# Patient Record
Sex: Male | Born: 1960 | Race: White | Hispanic: No | Marital: Single | Smoking: Current every day smoker
Health system: Southern US, Community
[De-identification: ages and names within clinical notes are randomized; demographics above are authoritative.]

## PROBLEM LIST (undated history)

## (undated) DIAGNOSIS — L409 Psoriasis, unspecified: Secondary | ICD-10-CM

## (undated) DIAGNOSIS — F1010 Alcohol abuse, uncomplicated: Secondary | ICD-10-CM

## (undated) DIAGNOSIS — B1920 Unspecified viral hepatitis C without hepatic coma: Secondary | ICD-10-CM

## (undated) DIAGNOSIS — R569 Unspecified convulsions: Secondary | ICD-10-CM

## (undated) DIAGNOSIS — J449 Chronic obstructive pulmonary disease, unspecified: Secondary | ICD-10-CM

## (undated) DIAGNOSIS — F419 Anxiety disorder, unspecified: Secondary | ICD-10-CM

## (undated) DIAGNOSIS — B1910 Unspecified viral hepatitis B without hepatic coma: Secondary | ICD-10-CM

## (undated) HISTORY — DX: Alcohol abuse, uncomplicated: F10.10

## (undated) HISTORY — DX: Unspecified viral hepatitis B without hepatic coma: B19.10

## (undated) HISTORY — DX: Anxiety disorder, unspecified: F41.9

## (undated) HISTORY — DX: Unspecified viral hepatitis C without hepatic coma: B19.20

---

## 2008-01-08 ENCOUNTER — Emergency Department: Payer: Self-pay | Admitting: Emergency Medicine

## 2008-09-08 HISTORY — PX: SKIN GRAFT: SHX250

## 2015-05-18 DIAGNOSIS — S62392A Other fracture of third metacarpal bone, right hand, initial encounter for closed fracture: Secondary | ICD-10-CM | POA: Insufficient documentation

## 2015-05-18 DIAGNOSIS — S62390A Other fracture of second metacarpal bone, right hand, initial encounter for closed fracture: Secondary | ICD-10-CM | POA: Insufficient documentation

## 2015-05-18 DIAGNOSIS — Y92410 Unspecified street and highway as the place of occurrence of the external cause: Secondary | ICD-10-CM | POA: Insufficient documentation

## 2015-05-18 DIAGNOSIS — Y998 Other external cause status: Secondary | ICD-10-CM | POA: Insufficient documentation

## 2015-05-18 DIAGNOSIS — S62394A Other fracture of fourth metacarpal bone, right hand, initial encounter for closed fracture: Secondary | ICD-10-CM | POA: Insufficient documentation

## 2015-05-18 DIAGNOSIS — Y9389 Activity, other specified: Secondary | ICD-10-CM | POA: Insufficient documentation

## 2015-05-18 DIAGNOSIS — Z720 Tobacco use: Secondary | ICD-10-CM | POA: Insufficient documentation

## 2015-05-19 ENCOUNTER — Encounter: Payer: Self-pay | Admitting: *Deleted

## 2015-05-19 ENCOUNTER — Emergency Department
Admission: EM | Admit: 2015-05-19 | Discharge: 2015-05-19 | Disposition: A | Discharge status: Home / Self Care | Payer: Self-pay | Attending: Emergency Medicine | Admitting: Emergency Medicine

## 2015-05-19 ENCOUNTER — Emergency Department: Payer: Self-pay

## 2015-05-19 DIAGNOSIS — S6291XA Unspecified fracture of right wrist and hand, initial encounter for closed fracture: Secondary | ICD-10-CM

## 2015-05-19 MED ORDER — OXYCODONE-ACETAMINOPHEN 5-325 MG PO TABS: 1.0000 | ORAL_TABLET | Freq: Four times a day (QID) | ORAL | Status: DC | PRN

## 2015-05-19 MED ORDER — OXYCODONE-ACETAMINOPHEN 5-325 MG PO TABS
1.0000 | ORAL_TABLET | Freq: Once | ORAL | Status: AC
  Administered 2015-05-19: 1 via ORAL
  Filled 2015-05-19: qty 1
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
## 2015-05-19 NOTE — Discharge Instructions (Signed)
You have nondisplaced fractures of the second, third, fourth, and likely fifth metacarpal (hand bone). Keep the splint on. Use the sling and keep your hand elevated when possible. Use Percocet if needed for pain control. Follow-up with orthopedics for reexamination and ongoing care early this coming week. Return to the emergency department if you have uncontrolled pain, if he loses sensation in your fingers or have difficulty with movement, or if you have other urgent concerns.  Hand Fracture, Metacarpals Fractures of metacarpals are breaks in the bones of the hand. They extend from the knuckles to the wrist. These bones can undergo many types of fractures. There are different ways of treating these fractures, all of which may be correct. TREATMENT  Hand fractures can be treated with:   Non-reduction - The fracture is casted without changing the positions of the fracture (bone pieces) involved. This fracture is usually left in a cast for 4 to 6 weeks or as your caregiver thinks necessary.  Closed reduction - The bones are moved back into position without surgery and then casted.  ORIF (open reduction and internal fixation) - The fracture site is opened and the bone pieces are fixed into place with some type of hardware, such as screws, etc. They are then casted. Your caregiver will discuss the type of fracture you have and the treatment that should be best for that problem. If surgery is chosen, let your caregivers know about the following.  LET YOUR CAREGIVERS KNOW ABOUT:  Allergies.  Medications you are taking, including herbs, eye drops, over the counter medications, and creams.  Use of steroids (by mouth or creams).  Previous problems with anesthetics or novocaine.  Possibility of pregnancy.  History of blood clots (thrombophlebitis).  History of bleeding or blood problems.  Previous surgeries.  Other health problems. AFTER THE PROCEDURE After surgery, you will be taken to the  recovery area where a nurse will watch and check your progress. Once you are awake, stable, and taking fluids well, barring other problems, you'll be allowed to go home. Once home, an ice pack applied to your operative site may help with pain and keep the swelling down. HOME CARE INSTRUCTIONS   Follow your caregiver's instructions as to activities, exercises, physical therapy, and driving a car.  Daily exercise is helpful for keeping range of motion and strength. Exercise as instructed.  To lessen swelling, keep the injured hand elevated above the level of your heart as much as possible.  Apply ice to the injury for 15-20 minutes each hour while awake for the first 2 days. Put the ice in a plastic bag and place a thin towel between the bag of ice and your cast.  Move the fingers of your casted hand several times a day.  If a plaster or fiberglass cast was applied:  Do not try to scratch the skin under the cast using a sharp or pointed object.  Check the skin around the cast every day. You may put lotion on red or sore areas.  Keep your cast dry. Your cast can be protected during bathing with a plastic bag. Do not put your cast into the water.  If a plaster splint was applied:  Wear your splint for as long as directed by your caregiver or until seen again.  Do not get your splint wet. Protect it during bathing with a plastic bag.  You may loosen the elastic bandage around the splint if your fingers start to get numb, tingle, get cold or  turn blue.  Do not put pressure on your cast or splint; this may cause it to break. Especially, do not lean plaster casts on hard surfaces for 24 hours after application.  Take medications as directed by your caregiver.  Only take over-the-counter or prescription medicines for pain, discomfort, or fever as directed by your caregiver.  Follow-up as provided by your caregiver. This is very important in order to avoid permanent injury or disability and  chronic pain. SEEK MEDICAL CARE IF:   Increased bleeding (more than a small spot) from beneath your cast or splint if there is beneath the cast as with an open reduction.  Redness, swelling, or increasing pain in the wound or from beneath your cast or splint.  Pus coming from wound or from beneath your cast or splint.  An unexplained oral temperature above 102 F (38.9 C) develops, or as your caregiver suggests.  A foul smell coming from the wound or dressing or from beneath your cast or splint.  You have a problem moving any of your fingers. SEEK IMMEDIATE MEDICAL CARE IF:   You develop a rash  You have difficulty breathing  You have any allergy problems If you do not have a window in your cast for observing the wound, a discharge or minor bleeding may show up as a stain on the outside of your cast. Report these findings to your caregiver. MAKE SURE YOU:   Understand these instructions.  Will watch your condition.  Will get help right away if you are not doing well or get worse. Document Released: 08/25/2005 Document Revised: 11/17/2011 Document Reviewed: 04/13/2008 [facility] Patient Information 2015 [place], [place]. This information is not intended to replace advice given to you by your health care provider. Make sure you discuss any questions you have with your health care provider.

## 2015-11-08 ENCOUNTER — Encounter: Payer: Self-pay | Admitting: Family Medicine

## 2015-11-08 ENCOUNTER — Ambulatory Visit (INDEPENDENT_AMBULATORY_CARE_PROVIDER_SITE_OTHER): Payer: Medicaid Other | Admitting: Family Medicine

## 2015-11-08 VITALS — BP 133/79 | HR 69 | Temp 98.5°F | Ht 67.2 in | Wt 227.0 lb

## 2015-11-08 DIAGNOSIS — Z1211 Encounter for screening for malignant neoplasm of colon: Secondary | ICD-10-CM

## 2015-11-08 DIAGNOSIS — F32A Depression, unspecified: Secondary | ICD-10-CM | POA: Insufficient documentation

## 2015-11-08 DIAGNOSIS — Z125 Encounter for screening for malignant neoplasm of prostate: Secondary | ICD-10-CM | POA: Diagnosis not present

## 2015-11-08 DIAGNOSIS — F1110 Opioid abuse, uncomplicated: Secondary | ICD-10-CM | POA: Diagnosis not present

## 2015-11-08 DIAGNOSIS — Z113 Encounter for screening for infections with a predominantly sexual mode of transmission: Secondary | ICD-10-CM | POA: Diagnosis not present

## 2015-11-08 DIAGNOSIS — B181 Chronic viral hepatitis B without delta-agent: Secondary | ICD-10-CM

## 2015-11-08 DIAGNOSIS — B182 Chronic viral hepatitis C: Secondary | ICD-10-CM | POA: Diagnosis not present

## 2015-11-08 DIAGNOSIS — F329 Major depressive disorder, single episode, unspecified: Secondary | ICD-10-CM | POA: Diagnosis not present

## 2015-11-08 DIAGNOSIS — Z23 Encounter for immunization: Secondary | ICD-10-CM | POA: Diagnosis not present

## 2015-11-08 DIAGNOSIS — B1910 Unspecified viral hepatitis B without hepatic coma: Secondary | ICD-10-CM | POA: Insufficient documentation

## 2015-11-08 DIAGNOSIS — F1010 Alcohol abuse, uncomplicated: Secondary | ICD-10-CM | POA: Insufficient documentation

## 2015-11-08 DIAGNOSIS — F419 Anxiety disorder, unspecified: Secondary | ICD-10-CM

## 2015-11-08 DIAGNOSIS — B1920 Unspecified viral hepatitis C without hepatic coma: Secondary | ICD-10-CM | POA: Insufficient documentation

## 2015-11-08 NOTE — Assessment & Plan Note (Signed)
Checking on labs, [place] with elastography ordered today. Referral to GI made today. Await results.

## 2015-11-08 NOTE — Assessment & Plan Note (Signed)
Does not want to take SSRI- referral to psychiatry made today. Continue to monitor.

## 2015-11-08 NOTE — Assessment & Plan Note (Signed)
In remission at this time. Last used 4 years ago. Continue to monitor.

## 2015-11-08 NOTE — Progress Notes (Signed)
BP 133/79 mmHg  Pulse 69  Temp(Src) 98.5 F (36.9 C)  Ht 5' 7.2" (1.707 m)  Wt 227 lb (102.967 kg)  BMI 35.34 kg/m2  SpO2 98%   Subjective:    Patient ID: Jimmy Simmons, male    DOB: [birth year], 55 y.o.   MRN: [id number]  HPI: Jimmy Simmons is a 55 y.o. male who presents today to establish care. He hasn't seen a doctor regularly in about 16 years  Chief Complaint  Patient presents with  . Hepatitis C  . Anxiety   He had a problem with oxycodone in the past, has not taken them in about 4 years. Has not had any in that time. Has not had any cravings at this time. He was on other heavier drugs, IV drugs, cocaine. Does not work with NA. Did it on his own. Feeling good now. No one around using at this time. Has been drinking large amounts of ETOH for about 12 years. Was drinking hard liquor about 1/2 to a pint a day up to this week and then started going down to about a couple of beers a day. Functioning alcoholic. Has never had DTs. Never had seizures coming off ETOH. Has never come off ETOH in a long time.   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 Hep C transmission: IV drugs as a teenager Genotype: to be drawn today Viral load:  To be drawn today Hepatology evaluation:no Liver biopsy:no  Cirrhosis: unknown Antiviral therapy:no Hepatocellular carcinoma screening: no Esophageal varices screening/EGD: no Hepatitis A Vaccine: unknown Hepatitis B Vaccine: unknown Pneumovax Vaccine: given today  ANXIETY/STRESS Duration: years and years Anxious mood: yes  Excessive worrying: yes Irritability: yes  Sweating: no Nausea: no Palpitations:yes Hyperventilation: yes Panic attacks: yes Agoraphobia: no  Obscessions/compulsions: no Depressed mood: yes Depression screen PHQ 2/9 11/08/2015  Decreased Interest 2  Down, Depressed, Hopeless 2  PHQ - 2 Score 4  Altered sleeping 2  Tired, decreased energy 2  Change in appetite 2  Feeling bad or failure  about yourself  2  Trouble concentrating 2  Moving slowly or fidgety/restless 1  Suicidal thoughts 0  PHQ-9 Score 15  Difficult doing work/chores Very difficult   GAD 7 : Generalized Anxiety Score 11/08/2015  Nervous, Anxious, on Edge 2  Control/stop worrying 2  Worry too much - different things 2  Trouble relaxing 2  Restless 1  Easily annoyed or irritable 2  Afraid - awful might happen 0  Total GAD 7 Score 11  Anxiety Difficulty Somewhat difficult  Anhedonia: no Weight changes: no Insomnia: yes hard to fall asleep  Hypersomnia: no Fatigue/loss of energy: yes Feelings of worthlessness: yes Feelings of guilt: yes Impaired concentration/indecisiveness: yes Suicidal ideations: no  Crying spells: yes Recent Stressors/Life Changes: yes   Relationship problems: no   Family stress: yes     Financial stress: yes    Job stress: yes    Recent death/loss: no  Relevant past medical, surgical, family and social history reviewed and updated as indicated. Interim medical history since our last visit reviewed. Allergies and medications reviewed and updated.  Review of Systems  Constitutional: Negative.   Respiratory: Negative.   Cardiovascular: Negative.   Gastrointestinal: Negative.   Psychiatric/Behavioral: Negative.     Per HPI unless specifically indicated above     Objective:    BP 133/79 mmHg  Pulse 69  Temp(Src) 98.5 F (36.9 C)  Ht 5' 7.2" (1.707 m)  Wt 227 lb (  102.967 kg)  BMI 35.34 kg/m2  SpO2 98%  Wt Readings from Last 3 Encounters:  11/08/15 227 lb (102.967 kg)  05/19/15 220 lb (99.791 kg)    Physical Exam  Constitutional: He is oriented to person, place, and time. He appears well-developed and well-nourished. No distress.  HENT:  Head: Normocephalic and atraumatic.  Right Ear: Hearing and external ear normal.  Left Ear: Hearing and external ear normal.  Nose: Nose normal.  Mouth/Throat: Oropharynx is clear and moist. No oropharyngeal exudate.  Eyes:  Conjunctivae and lids are normal. Pupils are equal, round, and reactive to light. Right eye exhibits no discharge. Left eye exhibits no discharge. No scleral icterus.  Neck: Normal range of motion. Neck supple. No JVD present. No tracheal deviation present. No thyromegaly present.  Cardiovascular: Normal rate, regular rhythm, normal heart sounds and intact distal pulses.  Exam reveals no gallop and no friction rub.   No murmur heard. Pulmonary/Chest: Effort normal and breath sounds normal. No stridor. No respiratory distress. He has no wheezes. He has no rales. He exhibits no tenderness.  Abdominal: Soft. Bowel sounds are normal. He exhibits no distension and no mass. There is no tenderness. There is no rebound and no guarding.  Musculoskeletal: Normal range of motion.  Lymphadenopathy:    He has no cervical adenopathy.  Neurological: He is alert and oriented to person, place, and time.  Skin: Skin is warm, dry and intact. No rash noted. No erythema. No pallor.  Psychiatric: He has a normal mood and affect. His speech is normal and behavior is normal. Judgment and thought content normal. Cognition and memory are normal.  Nursing note and vitals reviewed.   No results found for this or any previous visit.    Assessment & Plan:   Problem List Items Addressed This Visit      Digestive   Hepatitis C    Checking on labs, [place] with elastography ordered today. Referral to GI made today. Await results.       Relevant Orders   CBC with Differential/Platelet   Comprehensive metabolic panel   Hepatitis c vrs RNA detect by PCR-qual   Hepatitis B surface antibody   Hepatitis, Acute   Hepatitis C Genotype   [place] ARFI ELASTOGRAPHY ADD ON   US Abdomen Limited RUQ   Ambulatory referral to Gastroenterology   Hepatitis B    Checking on labs, [place] with elastography ordered today. Referral to GI made today. Await results.       Relevant Orders   CBC with Differential/Platelet   Comprehensive metabolic  panel   Hepatitis, Acute   [place] ARFI ELASTOGRAPHY ADD ON   US Abdomen Limited RUQ   Ambulatory referral to Gastroenterology     Other   Anxiety    Does not want to take SSRI- referral to psychiatry made today. Continue to monitor.       Relevant Orders   CBC with Differential/Platelet   Comprehensive metabolic panel   TSH   Ambulatory referral to Psychiatry   Alcohol abuse    Advised patient that it would be dangerous for him to try to come off entirely at home. Referral to psychiatry made today. Advised him to go to the hospital for detox when he is ready.      Relevant Orders   CBC with Differential/Platelet   Comprehensive metabolic panel   Ambulatory referral to Psychiatry   Opiate abuse, episodic    In remission at this time. Last used 4 years ago. Continue  to monitor.       Relevant Orders   Ambulatory referral to Psychiatry   Depression    Does not want to take SSRI- referral to psychiatry made today. Continue to monitor.       Relevant Orders   Ambulatory referral to Psychiatry    Other Visit Diagnoses    Screening for STD (sexually transmitted disease)    -  Primary    Labs drawn today.    Relevant Orders    HIV antibody    Screening for colon cancer        Referral generated today    Relevant Orders    Ambulatory referral to General Surgery    Ambulatory referral to Gastroenterology    Immunization due        Pneumovax, tdap and flu shot given today.    Screening for prostate cancer        Labs drawn today    Relevant Orders    PSA        Follow up plan: Return in about 4 weeks (around 12/06/2015) for follow up.

## 2015-11-08 NOTE — Assessment & Plan Note (Signed)
Advised patient that it would be dangerous for him to try to come off entirely at home. Referral to psychiatry made today. Advised him to go to the hospital for detox when he is ready.

## 2015-11-09 ENCOUNTER — Other Ambulatory Visit: Payer: Self-pay

## 2015-11-09 ENCOUNTER — Telehealth: Payer: Self-pay

## 2015-11-09 NOTE — Telephone Encounter (Signed)
Gastroenterology Pre-Procedure Review  Request Date: 11/15/15 Requesting Physician: Dr. Park Liter  PATIENT REVIEW QUESTIONS: The patient responded to the following health history questions as indicated:    1. Are you having any GI issues? no 2. Do you have a personal history of Polyps? no 3. Do you have a family history of Colon Cancer or Polyps? no 4. Diabetes Mellitus? no 5. Joint replacements in the past 12 months?no 6. Major health problems in the past 3 months?no 7. Any artificial heart valves, MVP, or defibrillator?no    MEDICATIONS & ALLERGIES:    Patient reports the following regarding taking any anticoagulation/antiplatelet therapy:   Plavix, Coumadin, Eliquis, Xarelto, Lovenox, Pradaxa, Brilinta, or Effient? no Aspirin? yes (ASA 81mg )  Patient confirms/reports the following medications:  No current outpatient prescriptions on file.   No current facility-administered medications for this visit.    Patient confirms/reports the following allergies:  No Known Allergies  No orders of the defined types were placed in this encounter.    AUTHORIZATION INFORMATION Primary Insurance: 1D#: Group #:  Secondary Insurance: 1D#: Group #:  SCHEDULE INFORMATION: Date: 11/15/15 Time: Location: [place]

## 2015-11-12 ENCOUNTER — Encounter: Payer: Self-pay | Admitting: *Deleted

## 2015-11-12 ENCOUNTER — Other Ambulatory Visit: Payer: Self-pay | Admitting: Family Medicine

## 2015-11-13 LAB — COMPREHENSIVE METABOLIC PANEL
ALT: 137 IU/L — AB (ref 0–44)
AST: 117 IU/L — AB (ref 0–40)
Albumin/Globulin Ratio: 1.2 (ref 1.1–2.5)
Albumin: 3.6 g/dL (ref 3.5–5.5)
Alkaline Phosphatase: 114 IU/L (ref 39–117)
BUN/Creatinine Ratio: 15 (ref 9–20)
BUN: 11 mg/dL (ref 6–24)
Bilirubin Total: 0.6 mg/dL (ref 0.0–1.2)
CALCIUM: 9 mg/dL (ref 8.7–10.2)
CO2: 23 mmol/L (ref 18–29)
Chloride: 103 mmol/L (ref 96–106)
Creatinine, Ser: 0.74 mg/dL — ABNORMAL LOW (ref 0.76–1.27)
GFR, EST AFRICAN AMERICAN: 122 mL/min/{1.73_m2} (ref >59)
GFR, EST NON AFRICAN AMERICAN: 105 mL/min/{1.73_m2} (ref >59)
GLUCOSE: 103 mg/dL — AB (ref 65–99)
Globulin, Total: 2.9 g/dL (ref 1.5–4.5)
POTASSIUM: 3.9 mmol/L (ref 3.5–5.2)
Sodium: 141 mmol/L (ref 134–144)
TOTAL PROTEIN: 6.5 g/dL (ref 6.0–8.5)

## 2015-11-13 LAB — HEPATITIS C VRS RNA DETECT BY PCR-QUAL: HCV RNA NAA Qualitative: POSITIVE — AB

## 2015-11-13 LAB — CBC WITH DIFFERENTIAL/PLATELET
BASOS ABS: 0 10*3/uL (ref 0.0–0.2)
Basos: 1 %
EOS (ABSOLUTE): 0.1 10*3/uL (ref 0.0–0.4)
Eos: 2 %
Hematocrit: 41.2 % (ref 37.5–51.0)
Hemoglobin: 13.9 g/dL (ref 12.6–17.7)
IMMATURE GRANS (ABS): 0 10*3/uL (ref 0.0–0.1)
IMMATURE GRANULOCYTES: 0 %
LYMPHS: 48 %
Lymphocytes Absolute: 2.9 10*3/uL (ref 0.7–3.1)
MCH: 34.8 pg — ABNORMAL HIGH (ref 26.6–33.0)
MCHC: 33.7 g/dL (ref 31.5–35.7)
MCV: 103 fL — ABNORMAL HIGH (ref 79–97)
MONOCYTES: 7 %
Monocytes Absolute: 0.4 10*3/uL (ref 0.1–0.9)
NEUTROS ABS: 2.5 10*3/uL (ref 1.4–7.0)
NEUTROS PCT: 42 %
PLATELETS: 73 10*3/uL — AB (ref 150–379)
RBC: 4 x10E6/uL — AB (ref 4.14–5.80)
RDW: 13.3 % (ref 12.3–15.4)
WBC: 6 10*3/uL (ref 3.4–10.8)

## 2015-11-13 LAB — TSH: TSH: 2.11 u[IU]/mL (ref 0.450–4.500)

## 2015-11-13 LAB — HEPATITIS C GENOTYPE

## 2015-11-13 LAB — HEPATITIS PANEL, ACUTE
HEP A IGM: NEGATIVE
HEP B S AG: NEGATIVE
Hep B C IgM: NEGATIVE

## 2015-11-13 LAB — HIV ANTIBODY (ROUTINE TESTING W REFLEX): HIV SCREEN 4TH GENERATION: NONREACTIVE

## 2015-11-13 LAB — PSA: Prostate Specific Ag, Serum: 0.7 ng/mL (ref 0.0–4.0)

## 2015-11-13 LAB — HEPATITIS B SURFACE ANTIBODY, QUANTITATIVE

## 2015-11-14 NOTE — Discharge Instructions (Signed)

## 2015-11-15 ENCOUNTER — Encounter
Admission: RE | Disposition: A | Discharge status: Home / Self Care | Payer: Self-pay | Source: Ambulatory Visit | Attending: Gastroenterology

## 2015-11-15 ENCOUNTER — Ambulatory Visit
Admission: RE | Admit: 2015-11-15 | Discharge: 2015-11-15 | Disposition: A | Discharge status: Home / Self Care | Payer: Medicaid Other | Source: Ambulatory Visit | Attending: Gastroenterology | Admitting: Gastroenterology

## 2015-11-15 ENCOUNTER — Ambulatory Visit: Payer: Medicaid Other | Admitting: Anesthesiology

## 2015-11-15 DIAGNOSIS — K621 Rectal polyp: Secondary | ICD-10-CM | POA: Diagnosis not present

## 2015-11-15 DIAGNOSIS — B1920 Unspecified viral hepatitis C without hepatic coma: Secondary | ICD-10-CM | POA: Diagnosis not present

## 2015-11-15 DIAGNOSIS — K641 Second degree hemorrhoids: Secondary | ICD-10-CM | POA: Diagnosis not present

## 2015-11-15 DIAGNOSIS — F17210 Nicotine dependence, cigarettes, uncomplicated: Secondary | ICD-10-CM | POA: Insufficient documentation

## 2015-11-15 DIAGNOSIS — Z1211 Encounter for screening for malignant neoplasm of colon: Secondary | ICD-10-CM | POA: Diagnosis not present

## 2015-11-15 DIAGNOSIS — D125 Benign neoplasm of sigmoid colon: Secondary | ICD-10-CM | POA: Diagnosis not present

## 2015-11-15 DIAGNOSIS — F419 Anxiety disorder, unspecified: Secondary | ICD-10-CM | POA: Insufficient documentation

## 2015-11-15 DIAGNOSIS — F1010 Alcohol abuse, uncomplicated: Secondary | ICD-10-CM | POA: Diagnosis not present

## 2015-11-15 HISTORY — PX: COLONOSCOPY WITH PROPOFOL: SHX5780

## 2015-11-15 HISTORY — PX: POLYPECTOMY: SHX5525

## 2015-11-15 SURGERY — COLONOSCOPY WITH PROPOFOL
Anesthesia: Monitor Anesthesia Care | Wound class: Contaminated

## 2015-11-15 MED ORDER — PROPOFOL 10 MG/ML IV BOLUS
INTRAVENOUS | Status: DC | PRN
  Administered 2015-11-15: 20 mg via INTRAVENOUS
  Administered 2015-11-15: 70 mg via INTRAVENOUS
  Administered 2015-11-15 (×8): 30 mg via INTRAVENOUS

## 2015-11-15 MED ORDER — LACTATED RINGERS IV SOLN
INTRAVENOUS | Status: DC
  Administered 2015-11-15: 11:00:00 via INTRAVENOUS

## 2015-11-15 MED ORDER — LIDOCAINE HCL (CARDIAC) 20 MG/ML IV SOLN
INTRAVENOUS | Status: DC | PRN
Start: 2015-11-15 — End: 2015-11-15
  Administered 2015-11-15: 50 mg via INTRAVENOUS

## 2015-11-15 MED ORDER — STERILE WATER FOR IRRIGATION IR SOLN
Status: DC | PRN
  Administered 2015-11-15: 12:00:00

## 2015-11-15 SURGICAL SUPPLY — 28 items
CANISTER SUCT 1200ML W/VALVE (MISCELLANEOUS) ×4 IMPLANT
FCP ESCP3.2XJMB 240X2.8X (MISCELLANEOUS)
FORCEPS BIOP RAD 4 LRG CAP 4 (CUTTING FORCEPS) IMPLANT
FORCEPS BIOP RJ4 240 W/NDL (MISCELLANEOUS)
FORCEPS ESCP3.2XJMB 240X2.8X (MISCELLANEOUS) IMPLANT
GOWN CVR UNV OPN BCK APRN NK (MISCELLANEOUS) ×4 IMPLANT
GOWN ISOL THUMB LOOP REG UNIV (MISCELLANEOUS) ×4
HEMOCLIP INSTINCT (CLIP) IMPLANT
INJECTOR VARIJECT VIN23 (MISCELLANEOUS) IMPLANT
KIT CO2 TUBING (TUBING) IMPLANT
KIT DEFENDO VALVE AND CONN (KITS) IMPLANT
KIT ENDO PROCEDURE OLY (KITS) ×4 IMPLANT
LIGATOR MULTIBAND 6SHOOTER MBL (MISCELLANEOUS) IMPLANT
MARKER SPOT ENDO TATTOO 5ML (MISCELLANEOUS) IMPLANT
PAD GROUND ADULT SPLIT (MISCELLANEOUS) IMPLANT
SNARE SHORT THROW 13M SML OVAL (MISCELLANEOUS) ×4 IMPLANT
SNARE SHORT THROW 30M LRG OVAL (MISCELLANEOUS) IMPLANT
SPOT EX ENDOSCOPIC TATTOO (MISCELLANEOUS)
SUCTION POLY TRAP 4CHAMBER (MISCELLANEOUS) IMPLANT
TRAP SUCTION POLY (MISCELLANEOUS) ×4 IMPLANT
TUBING CONN 6MMX3.1M (TUBING)
TUBING SUCTION CONN 0.25 STRL (TUBING) IMPLANT
UNDERPAD 30X60 958B10 (PK) (MISCELLANEOUS) IMPLANT
VALVE BIOPSY ENDO (VALVE) IMPLANT
VARIJECT INJECTOR VIN23 (MISCELLANEOUS)
WATER AUXILLARY (MISCELLANEOUS) IMPLANT
WATER STERILE IRR 250ML POUR (IV SOLUTION) ×4 IMPLANT
WATER STERILE IRR 500ML POUR (IV SOLUTION) IMPLANT

## 2015-11-15 NOTE — Anesthesia Preprocedure Evaluation (Signed)
Anesthesia Evaluation  Patient identified by MRN, date of birth, ID band Patient awake    Reviewed: Allergy & Precautions, H&P , NPO status   Airway Mallampati: II  TM Distance: >3 FB Neck ROM: full    Dental   Pulmonary Current Smoker,    Pulmonary exam normal        Cardiovascular Normal cardiovascular exam     Neuro/Psych PSYCHIATRIC DISORDERS    GI/Hepatic (+) Hepatitis -, B, C  Endo/Other    Renal/GU      Musculoskeletal   Abdominal   Peds  Hematology   Anesthesia Other Findings   Reproductive/Obstetrics                             Anesthesia Physical Anesthesia Plan  ASA: II  Anesthesia Plan: MAC   Post-op Pain Management:    Induction:   Airway Management Planned:   Additional Equipment:   Intra-op Plan:   Post-operative Plan:   Informed Consent: I have reviewed the patients History and Physical, chart, labs and discussed the procedure including the risks, benefits and alternatives for the proposed anesthesia with the patient or authorized representative who has indicated his/her understanding and acceptance.     Plan Discussed with: CRNA  Anesthesia Plan Comments:         Anesthesia Quick Evaluation

## 2015-11-15 NOTE — Anesthesia Postprocedure Evaluation (Signed)
Anesthesia Post Note  Patient: Jimmy Simmons  Procedure(s) Performed: Procedure(s) (LRB): COLONOSCOPY WITH PROPOFOL (N/A) POLYPECTOMY  Patient location during evaluation: PACU Anesthesia Type: MAC Level of consciousness: awake and alert Pain management: pain level controlled Vital Signs Assessment: post-procedure vital signs reviewed and stable Respiratory status: spontaneous breathing, nonlabored ventilation, respiratory function stable and patient connected to nasal cannula oxygen Cardiovascular status: stable and blood pressure returned to baseline Anesthetic complications: no    Amaryllis Dyke

## 2015-11-15 NOTE — Transfer of Care (Signed)
Immediate Anesthesia Transfer of Care Note  Patient: Jimmy Simmons  Procedure(s) Performed: Procedure(s): COLONOSCOPY WITH PROPOFOL (N/A) POLYPECTOMY  Patient Location: PACU  Anesthesia Type: MAC  Level of Consciousness: awake, alert  and patient cooperative  Airway and Oxygen Therapy: Patient Spontanous Breathing and Patient connected to supplemental oxygen  Post-op Assessment: Post-op Vital signs reviewed, Patient's Cardiovascular Status Stable, Respiratory Function Stable, Patent Airway and No signs of Nausea or vomiting  Post-op Vital Signs: Reviewed and stable  Complications: No apparent anesthesia complications

## 2015-11-15 NOTE — Anesthesia Procedure Notes (Signed)
Procedure Name: MAC Performed by: Mckensie Scotti Pre-anesthesia Checklist: Patient identified, Emergency Drugs available, Suction available, Timeout performed and Patient being monitored Patient Re-evaluated:Patient Re-evaluated prior to inductionOxygen Delivery Method: Nasal cannula Placement Confirmation: positive ETCO2       
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
## 2015-11-15 NOTE — Op Note (Signed)
[facility] Patient Name: Jimmy Simmons Procedure Date: 11/15/2015 11:26 AM MRN: [id number] Account #: [id number] Date of Birth: 11-01-1960 Admit Type: Outpatient Age: 55 Room: [facility] OR ROOM 01 Gender: Male Note Status: Finalized Procedure:            Colonoscopy Indications:          Screening for colorectal malignant neoplasm Providers:            Lucilla Lame, MD Referring MD:         Valerie Roys (Referring MD) Medicines:            Propofol per Anesthesia Complications:        No immediate complications. Procedure:            Pre-Anesthesia Assessment:                       - Prior to the procedure, a History and Physical was                        performed, and patient medications and allergies were                        reviewed. The patient's tolerance of previous                        anesthesia was also reviewed. The risks and benefits of                        the procedure and the sedation options and risks were                        discussed with the patient. All questions were                        answered, and informed consent was obtained. Prior                        Anticoagulants: The patient has taken no previous                        anticoagulant or antiplatelet agents. ASA Grade                        Assessment: II - A patient with mild systemic disease.                        After reviewing the risks and benefits, the patient was                        deemed in satisfactory condition to undergo the                        procedure.                       After obtaining informed consent, the colonoscope was                        passed under direct vision. Throughout the procedure,  the patient's blood pressure, pulse, and oxygen                        saturations were monitored continuously. The was                        introduced through the anus and advanced to the the                         cecum, identified by appendiceal orifice and ileocecal                        valve. The colonoscopy was performed without                        difficulty. The patient tolerated the procedure well.                        The quality of the bowel preparation was excellent. Findings:      The perianal and digital rectal examinations were normal.      A 5 mm polyp was found in the rectum. The polyp was sessile. The polyp       was removed with a cold snare. Resection and retrieval were complete.      A 5 mm polyp was found in the sigmoid colon. The polyp was sessile. The       polyp was removed with a cold snare. Resection and retrieval were       complete.      Non-bleeding internal hemorrhoids were found during retroflexion. The       hemorrhoids were Grade II (internal hemorrhoids that prolapse but reduce       spontaneously). Impression:           - One 5 mm polyp in the rectum, removed with a cold                        snare. Resected and retrieved.                       - One 5 mm polyp in the sigmoid colon, removed with a                        cold snare. Resected and retrieved.                       - Non-bleeding internal hemorrhoids. Recommendation:       - Repeat colonoscopy in 5 years if polyp adenoma and 10                        years if hyperplastic Procedure Code(s):    --- Professional ---                       [phone], Colonoscopy, flexible; with removal of tumor(s),                        polyp(s), or other lesion(s) by snare technique Diagnosis Code(s):    --- Professional ---                       Z12.11,  Encounter for screening for malignant neoplasm                        of colon                       K62.1, Rectal polyp                       D12.5, Benign neoplasm of sigmoid colon CPT copyright 2016 American Medical Association. All rights reserved. The codes documented in this report are preliminary and upon coder review may  be revised to meet current  compliance requirements. Lucilla Lame, MD 11/15/2015 11:50:05 AM This report has been signed electronically. Number of Addenda: 0 Note Initiated On: 11/15/2015 11:26 AM Scope Withdrawal Time: 0 hours 7 minutes 12 seconds  Total Procedure Duration: 0 hours 10 minutes 43 seconds       [facility]

## 2015-11-16 ENCOUNTER — Encounter: Payer: Self-pay | Admitting: Gastroenterology

## 2015-11-20 LAB — HCV RT-PCR, QUANT (NON-GRAPH)
HCV QUANTITATIVE BASELINE: 760000 IU/mL
HCV Quantitative Log: 6.279 Log10copy/mL
HCV Quantitative: 1900000 Copies/mL
IU log10: 5.881 Log10 IU/mL

## 2015-11-20 LAB — SPECIMEN STATUS REPORT

## 2015-11-23 ENCOUNTER — Ambulatory Visit
Admission: RE | Admit: 2015-11-23 | Discharge: 2015-11-23 | Disposition: A | Discharge status: Home / Self Care | Payer: Medicaid Other | Source: Ambulatory Visit | Attending: Family Medicine | Admitting: Family Medicine

## 2015-11-23 ENCOUNTER — Ambulatory Visit: Payer: No Typology Code available for payment source

## 2015-11-23 DIAGNOSIS — B181 Chronic viral hepatitis B without delta-agent: Secondary | ICD-10-CM

## 2015-11-23 DIAGNOSIS — B182 Chronic viral hepatitis C: Secondary | ICD-10-CM

## 2015-11-26 ENCOUNTER — Encounter: Payer: Self-pay | Admitting: Gastroenterology

## 2015-11-28 ENCOUNTER — Ambulatory Visit
Admission: RE | Admit: 2015-11-28 | Discharge: 2015-11-28 | Disposition: A | Discharge status: Home / Self Care | Payer: Medicaid Other | Source: Ambulatory Visit | Attending: Family Medicine | Admitting: Family Medicine

## 2015-11-28 DIAGNOSIS — K7689 Other specified diseases of liver: Secondary | ICD-10-CM | POA: Diagnosis not present

## 2015-11-28 DIAGNOSIS — B181 Chronic viral hepatitis B without delta-agent: Secondary | ICD-10-CM | POA: Insufficient documentation

## 2015-11-28 DIAGNOSIS — B182 Chronic viral hepatitis C: Secondary | ICD-10-CM | POA: Insufficient documentation

## 2015-12-06 ENCOUNTER — Ambulatory Visit (INDEPENDENT_AMBULATORY_CARE_PROVIDER_SITE_OTHER): Payer: Medicaid Other | Admitting: Family Medicine

## 2015-12-06 ENCOUNTER — Encounter: Payer: Self-pay | Admitting: Family Medicine

## 2015-12-06 VITALS — BP 139/87 | HR 75 | Temp 97.4°F | Ht 67.2 in | Wt 225.0 lb

## 2015-12-06 DIAGNOSIS — R252 Cramp and spasm: Secondary | ICD-10-CM | POA: Diagnosis not present

## 2015-12-06 DIAGNOSIS — Z720 Tobacco use: Secondary | ICD-10-CM

## 2015-12-06 DIAGNOSIS — D696 Thrombocytopenia, unspecified: Secondary | ICD-10-CM

## 2015-12-06 DIAGNOSIS — D229 Melanocytic nevi, unspecified: Secondary | ICD-10-CM | POA: Diagnosis not present

## 2015-12-06 DIAGNOSIS — Z23 Encounter for immunization: Secondary | ICD-10-CM

## 2015-12-06 DIAGNOSIS — F1010 Alcohol abuse, uncomplicated: Secondary | ICD-10-CM | POA: Diagnosis not present

## 2015-12-06 DIAGNOSIS — B182 Chronic viral hepatitis C: Secondary | ICD-10-CM | POA: Diagnosis not present

## 2015-12-06 NOTE — Assessment & Plan Note (Signed)
To see Dr. Allen Norris May 1st for eval of hep C and hep B. Hep A vaccine given today. Continue to monitor.

## 2015-12-06 NOTE — Assessment & Plan Note (Signed)
Encouraged patient to avoid ETOH, Call with any problems. Continue to monitor.

## 2015-12-06 NOTE — Progress Notes (Signed)
BP 139/87 mmHg  Pulse 75  Temp(Src) 97.4 F (36.3 C)  Ht 5' 7.2" (1.707 m)  Wt 225 lb (102.059 kg)  BMI 35.03 kg/m2  SpO2 98%   Subjective:    Patient ID: Jimmy Simmons, male    DOB: September 15, 1960, 55 y.o.   MRN: [id number]  HPI: Jimmy Simmons is a 55 y.o. male  Chief Complaint  Patient presents with  . Alcohol Problem  . Hepatitis C  . Rash   Hasn't drank since last weekend. Had a couple of beers last weekend, but has been feeling a little edgy. Didn't end up going to the hospital to get detoxed, but has been doing OK on his own.   HEPATITIS C Duration since diagnosis: chronic Hep C transmission:  Genotype: 1a Viral load:  1900000 Hepatology evaluation:no Liver biopsy:no  Cirrhosis: no Antiviral therapy:no Hepatocellular carcinoma screening: no Esophageal varices screening/EGD: no Hepatitis A Vaccine: Given today Hepatitis B Vaccine: Not up to Date Pneumovax Vaccine: Up to Date  LEG CRAMPS- Has been having some really bad cramps for a couple of years in his legs and into his arms.  Duration: past couple of years Pain: yes Severity: severe  Quality:  cramps Location: arms and legs  Bilateral:  yes, L>R Onset: gradual Frequency: intermittent worse with activity and being in the heat Time of  day:   at random Sudden unintentional leg jerking:   no Paresthesias:   no Decreased sensation:  no Weakness:   no Insomnia:   yes Fatigue:   yes Alleviating factors:  Aggravating factors: Status: fluctuating Treatments attempted: PRN flexeril  Relevant past medical, surgical, family and social history reviewed and updated as indicated. Interim medical history since our last visit reviewed. Allergies and medications reviewed and updated.  Review of Systems  Constitutional: Negative for fever, chills, diaphoresis, activity change, appetite change, fatigue and unexpected weight change.  Respiratory: Negative for apnea, cough, choking, chest tightness, shortness of breath,  wheezing and stridor.   Cardiovascular: Negative for chest pain, palpitations and leg swelling.  Musculoskeletal: Positive for myalgias. Negative for back pain, joint swelling, arthralgias, gait problem, neck pain and neck stiffness.  Skin: Negative for color change, pallor, rash and wound.  Psychiatric/Behavioral: Positive for sleep disturbance. Negative for suicidal ideas, hallucinations, behavioral problems, confusion, self-injury, dysphoric mood, decreased concentration and agitation. The patient is nervous/anxious. The patient is not hyperactive.     Per HPI unless specifically indicated above     Objective:    BP 139/87 mmHg  Pulse 75  Temp(Src) 97.4 F (36.3 C)  Ht 5' 7.2" (1.707 m)  Wt 225 lb (102.059 kg)  BMI 35.03 kg/m2  SpO2 98%  Wt Readings from Last 3 Encounters:  12/06/15 225 lb (102.059 kg)  11/15/15 219 lb (99.338 kg)  11/08/15 227 lb (102.967 kg)    Physical Exam  Constitutional: He is oriented to person, place, and time. He appears well-developed and well-nourished. No distress.  HENT:  Head: Normocephalic and atraumatic.  Right Ear: Hearing normal.  Left Ear: Hearing normal.  Nose: Nose normal.  Eyes: Conjunctivae and lids are normal. Right eye exhibits no discharge. Left eye exhibits no discharge. No scleral icterus.  Cardiovascular: Normal rate, regular rhythm, normal heart sounds and intact distal pulses.  Exam reveals no gallop and no friction rub.   No murmur heard. Pulmonary/Chest: Effort normal and breath sounds normal. No respiratory distress. He has no wheezes. He has no rales. He exhibits no tenderness.  Musculoskeletal: Normal range of motion.  Neurological: He is alert and oriented to person, place, and time.  Skin: Skin is warm, dry and intact. No rash noted. He is not diaphoretic. No erythema. No pallor.  >100 moles on arms and legs, extensive sun damage   Psychiatric: He has a normal mood and affect. His speech is normal and behavior is  normal. Judgment and thought content normal. Cognition and memory are normal.  Nursing note and vitals reviewed.   Results for orders placed or performed in visit on 11/08/15  HIV antibody  Result Value Ref Range   HIV Screen 4th Generation wRfx Non Reactive Non Reactive  CBC with Differential/Platelet  Result Value Ref Range   WBC 6.0 3.4 - 10.8 x10E3/uL   RBC 4.00 (L) 4.14 - 5.80 x10E6/uL   Hemoglobin 13.9 12.6 - 17.7 g/dL   Hematocrit 41.2 37.5 - 51.0 %   MCV 103 (H) 79 - 97 fL   MCH 34.8 (H) 26.6 - 33.0 pg   MCHC 33.7 31.5 - 35.7 g/dL   RDW 13.3 12.3 - 15.4 %   Platelets 73 (LL) 150 - 379 x10E3/uL   Neutrophils 42 %   Lymphs 48 %   Monocytes 7 %   Eos 2 %   Basos 1 %   Neutrophils Absolute 2.5 1.4 - 7.0 x10E3/uL   Lymphocytes Absolute 2.9 0.7 - 3.1 x10E3/uL   Monocytes Absolute 0.4 0.1 - 0.9 x10E3/uL   EOS (ABSOLUTE) 0.1 0.0 - 0.4 x10E3/uL   Basophils Absolute 0.0 0.0 - 0.2 x10E3/uL   Immature Granulocytes 0 %   Immature Grans (Abs) 0.0 0.0 - 0.1 x10E3/uL   Hematology Comments: Note:   Comprehensive metabolic panel  Result Value Ref Range   Glucose 103 (H) 65 - 99 mg/dL   BUN 11 6 - 24 mg/dL   Creatinine, Ser 0.74 (L) 0.76 - 1.27 mg/dL   GFR calc non Af Amer 105 >59 mL/min/1.73   GFR calc Af Amer 122 >59 mL/min/1.73   BUN/Creatinine Ratio 15 9 - 20   Sodium 141 134 - 144 mmol/L   Potassium 3.9 3.5 - 5.2 mmol/L   Chloride 103 96 - 106 mmol/L   CO2 23 18 - 29 mmol/L   Calcium 9.0 8.7 - 10.2 mg/dL   Total Protein 6.5 6.0 - 8.5 g/dL   Albumin 3.6 3.5 - 5.5 g/dL   Globulin, Total 2.9 1.5 - 4.5 g/dL   Albumin/Globulin Ratio 1.2 1.1 - 2.5   Bilirubin Total 0.6 0.0 - 1.2 mg/dL   Alkaline Phosphatase 114 39 - 117 IU/L   AST 117 (H) 0 - 40 IU/L   ALT 137 (H) 0 - 44 IU/L  Hepatitis c vrs RNA detect by PCR-qual  Result Value Ref Range   HCV RNA NAA Qualitative Positive (A) Negative  Hepatitis B surface antibody  Result Value Ref Range   Hepatitis B Surf Ab Quant <3.1  (L) Immunity>9.9 mIU/mL  Hepatitis, Acute  Result Value Ref Range   Hep A IgM Negative Negative   Hepatitis B Surface Ag Negative Negative   Hep B C IgM Negative Negative   Hep C Virus Ab >11.0 (H) 0.0 - 0.9 s/co ratio  Hepatitis C Genotype  Result Value Ref Range   Hepatitis C Genotype 1a    Please Note (HCV): Comment   PSA  Result Value Ref Range   Prostate Specific Ag, Serum 0.7 0.0 - 4.0 ng/mL  TSH  Result Value Ref Range   TSH 2.110 0.450 - 4.500 uIU/mL  HCV RT-PCR, Quant (Non-Graph)  Result Value Ref Range   HCV Quantitative 1900000 Copies/mL   HCV Quantitative Log 6.279 Log10copy/mL   HCV QUANTITATIVE BASELINE 760000 IU/mL   IU log10 5.881 Log10 IU/mL   PCR AMPLIFICATION+DETECTION Comment    TEST INFORMATION Comment   Specimen status report  Result Value Ref Range   specimen status report Comment       Assessment & Plan:   Problem List Items Addressed This Visit      Digestive   Hepatitis C - Primary    To see Dr. Allen Norris May 1st for eval of hep C and hep B. Hep A vaccine given today. Continue to monitor.         Other   Alcohol abuse    Encouraged patient to avoid ETOH, Call with any problems. Continue to monitor.        Other Visit Diagnoses    Nevus        Extensive sun damage. Will refer to dermatology for evaluation. Referral generated today.    Relevant Orders    Ambulatory referral to Dermatology    Thrombocytopenia ([place])        Checking CBC again today, probably due to drinking and hepatitis. Await results.     Relevant Orders    CBC with Differential/Platelet    Immunization due        Hep A vaccine given today, return un 6 months for 2nd hep A. To hold on Hep B given carrier state until GI eval.    Relevant Orders    Hepatitis A vaccine adult IM    Cramp of both lower extremities        Will start magnesium, gatorade in the heat, will check ABI- vascular referral made today.    Relevant Orders    Ambulatory referral to Vascular Surgery     Tobacco abuse        Will refer to vascular for ABIs given smoking history and cramping. Referral generated today. Call with concerns. Await consult.     Relevant Orders    Ambulatory referral to Vascular Surgery        Follow up plan: Return 6-8 weeks.

## 2015-12-06 NOTE — Patient Instructions (Addendum)
Leg Cramps Leg cramps occur when a muscle or muscles tighten and you have no control over this tightening (involuntary muscle contraction). Muscle cramps can develop in any muscle, but the most common place is in the calf muscles of the leg. Those cramps can occur during exercise or when you are at rest. Leg cramps are painful, and they may last for a few seconds to a few minutes. Cramps may return several times before they finally stop. Usually, leg cramps are not caused by a serious medical problem. In many cases, the cause is not known. Some common causes include:  Overexertion.  Overuse from repetitive motions, or doing the same thing over and over.  Remaining in a certain position for a long period of time.  Improper preparation, form, or technique while performing a sport or an activity.  Dehydration.  Injury.  Side effects of some medicines.  Abnormally low levels of the salts and ions in your blood (electrolytes), especially potassium and calcium. These levels could be low if you are taking water pills (diuretics) or if you are pregnant. HOME CARE INSTRUCTIONS Watch your condition for any changes. Taking the following actions may help to lessen any discomfort that you are feeling:  Stay well-hydrated. Drink enough fluid to keep your urine clear or pale yellow.  Try massaging, stretching, and relaxing the affected muscle. Do this for several minutes at a time.  For tight or tense muscles, use a warm towel, heating pad, or hot shower water directed to the affected area.  If you are sore or have pain after a cramp, applying ice to the affected area may relieve discomfort.  Put ice in a plastic bag.  Place a towel between your skin and the bag.  Leave the ice on for 20 minutes, 2-3 times per day.  Avoid strenuous exercise for several days if you have been having frequent leg cramps.  Make sure that your diet includes the essential minerals for your muscles to work  normally.  Take medicines only as directed by your health care provider. SEEK MEDICAL CARE IF:  Your leg cramps get more severe or more frequent, or they do not improve over time.  Your foot becomes cold, numb, or blue.   This information is not intended to replace advice given to you by your health care provider. Make sure you discuss any questions you have with your health care provider.   Document Released: 10/02/2004 Document Revised: 01/09/2015 Document Reviewed: 08/02/2014 Elsevier Interactive Patient Education 2016 Elsevier Inc. Hepatitis A Vaccine: What You Need to Know 1. Why get vaccinated? Hepatitis A is a serious liver disease. It is caused by the hepatitis A virus (HAV). HAV is spread from person to person through contact with the feces (stool) of people who are infected, which can easily happen if someone does not wash his or her hands properly. You can also get hepatitis A from food, water, or objects contaminated with HAV. Symptoms of hepatitis A can include:  fever, fatigue, loss of appetite, nausea, vomiting, and/or joint pain  severe stomach pains and diarrhea (mainly in children), or  jaundice (yellow skin or eyes, dark urine, clay-colored bowel movements). These symptoms usually appear 2 to 6 weeks after exposure and usually last less than 2 months, although some people can be ill for as long as 6 months. If you have hepatitis A you may be too ill to work. Children often do not have symptoms, but most adults do. You can spread HAV without having  symptoms. Hepatitis A can cause liver failure and death, although this is rare and occurs more commonly in persons 63 years of age or older and persons with other liver diseases, such as hepatitis B or C. Hepatitis A vaccine can prevent hepatitis A. Hepatitis A vaccines were recommended in the [place] beginning in 1996. Since then, the number of cases reported each year in the [place] has dropped from around 31,000 cases to  fewer than 1,500 cases. 2. Hepatitis A vaccine Hepatitis A vaccine is an inactivated (killed) vaccine. You will need 2 doses for long-lasting protection. These doses should be given at least 6 months apart. Children are routinely vaccinated between their first and second birthdays (57 through 30 months of age). Older children and adolescents can get the vaccine after 23 months. Adults who have not been vaccinated previously and want to be protected against hepatitis A can also get the vaccine. You should get hepatitis A vaccine if you:  are traveling to countries where hepatitis A is common,  are a man who has sex with other men,  use illegal drugs,  have a chronic liver disease such as hepatitis B or hepatitis C,  are being treated with clotting-factor concentrates,  work with hepatitis A-infected animals or in a hepatitis A research laboratory, or  expect to have close personal contact with an international adoptee from a country where hepatitis A is common Ask your healthcare provider if you want more information about any of these groups. There are no known risks to getting hepatitis A vaccine at the same time as other vaccines. 3. Some people should not get this vaccine Tell the person who is giving you the vaccine:  If you have any severe, life-threatening allergies. If you ever had a life-threatening allergic reaction after a dose of hepatitis A vaccine, or have a severe allergy to any part of this vaccine, you may be advised not to get vaccinated. Ask your health care provider if you want information about vaccine components.  If you are not feeling well. If you have a mild illness, such as a cold, you can probably get the vaccine today. If you are moderately or severely ill, you should probably wait until you recover. Your doctor can advise you. 4. Risks of a vaccine reaction With any medicine, including vaccines, there is a chance of side effects. These are usually mild and go  away on their own, but serious reactions are also possible. Most people who get hepatitis A vaccine do not have any problems with it. Minor problems following hepatitis A vaccine include:  soreness or redness where the shot was given  low-grade fever  headache  tiredness If these problems occur, they usually begin soon after the shot and last 1 or 2 days. Your doctor can tell you more about these reactions. Other problems that could happen after this vaccine:  People sometimes faint after a medical procedure, including vaccination. Sitting or lying down for about 15 minutes can help prevent fainting, and injuries caused by a fall. Tell your provider if you feel dizzy, or have vision changes or ringing in the ears.  Some people get shoulder pain that can be more severe and longer lasting than the more routine soreness that can follow injections. This happens very rarely.  Any medication can cause a severe allergic reaction. Such reactions from a vaccine are very rare, estimated at about 1 in a million doses, and would happen within a few minutes to a few hours  after the vaccination. As with any medicine, there is a very remote chance of a vaccine causing a serious injury or death. The safety of vaccines is always being monitored. For more information, visit: http://www.aguilar.org/ 5. What if there is a serious problem? What should I look for?  Look for anything that concerns you, such as signs of a severe allergic reaction, very high fever, or unusual behavior. Signs of a severe allergic reaction can include hives, swelling of the face and throat, difficulty breathing, a fast heartbeat, dizziness, and weakness. These would start a few minutes to a few hours after the vaccination. What should I do?  If you think it is a severe allergic reaction or other emergency that can't wait, call 9-1-1 or get to the nearest hospital. Otherwise, call your clinic. Afterward, the reaction should be  reported to the Vaccine Adverse Event Reporting System (VAERS). Your doctor should file this report, or you can do it yourself through the VAERS web site at www.vaers.SamedayNews.es, or by calling 260-585-8639. VAERS does not give medical advice. 6. The National Vaccine Injury Compensation Program The [employer] Vaccine Injury Compensation Program (VICP) is a federal program that was created to compensate people who may have been injured by certain vaccines. Persons who believe they may have been injured by a vaccine can learn about the program and about filing a claim by calling 207-874-8790 or visiting the [place] website at GoldCloset.com.ee. There is a time limit to file a claim for compensation. 7. How can I learn more?  Ask your healthcare provider. He or she can give you the vaccine package insert or suggest other sources of information.  Call your local or state health department.  Contact the Centers for Disease Control and Prevention (CDC):  Call 930 674 9766 (1-800-CDC-INFO) or  Visit CDC's website at http://hunter.com/ CDC Hepatitis A Vaccine VIS (03/28/2015)   This information is not intended to replace advice given to you by your health care provider. Make sure you discuss any questions you have with your health care provider.   Document Released: 06/19/2006 Document Revised: 05/16/2015 Document Reviewed: 04/14/2015 Elsevier Interactive Patient Education [employer].

## 2015-12-07 LAB — CBC WITH DIFFERENTIAL/PLATELET
BASOS ABS: 0.1 10*3/uL (ref 0.0–0.2)
Basos: 1 %
EOS (ABSOLUTE): 0.1 10*3/uL (ref 0.0–0.4)
Eos: 1 %
Hematocrit: 40 % (ref 37.5–51.0)
Hemoglobin: 13.7 g/dL (ref 12.6–17.7)
IMMATURE GRANS (ABS): 0 10*3/uL (ref 0.0–0.1)
Immature Granulocytes: 0 %
LYMPHS ABS: 2.7 10*3/uL (ref 0.7–3.1)
LYMPHS: 51 %
MCH: 34.9 pg — AB (ref 26.6–33.0)
MCHC: 34.3 g/dL (ref 31.5–35.7)
MCV: 102 fL — ABNORMAL HIGH (ref 79–97)
MONOCYTES: 6 %
Monocytes Absolute: 0.3 10*3/uL (ref 0.1–0.9)
Neutrophils Absolute: 2.2 10*3/uL (ref 1.4–7.0)
Neutrophils: 41 %
PLATELETS: 71 10*3/uL — AB (ref 150–379)
RBC: 3.92 x10E6/uL — AB (ref 4.14–5.80)
RDW: 13.7 % (ref 12.3–15.4)
WBC: 5.4 10*3/uL (ref 3.4–10.8)

## 2016-01-07 ENCOUNTER — Other Ambulatory Visit: Payer: Self-pay | Admitting: Family Medicine

## 2016-01-07 ENCOUNTER — Encounter (INDEPENDENT_AMBULATORY_CARE_PROVIDER_SITE_OTHER): Payer: Self-pay

## 2016-01-07 ENCOUNTER — Other Ambulatory Visit
Admission: RE | Admit: 2016-01-07 | Discharge: 2016-01-07 | Disposition: A | Discharge status: Home / Self Care | Payer: Medicaid Other | Source: Ambulatory Visit | Attending: Gastroenterology | Admitting: Gastroenterology

## 2016-01-07 ENCOUNTER — Ambulatory Visit (INDEPENDENT_AMBULATORY_CARE_PROVIDER_SITE_OTHER): Payer: Medicaid Other | Admitting: Gastroenterology

## 2016-01-07 ENCOUNTER — Encounter: Payer: Self-pay | Admitting: Gastroenterology

## 2016-01-07 VITALS — BP 121/73 | HR 80 | Temp 98.2°F | Ht 67.0 in | Wt 224.0 lb

## 2016-01-07 DIAGNOSIS — B182 Chronic viral hepatitis C: Secondary | ICD-10-CM

## 2016-01-07 DIAGNOSIS — Z23 Encounter for immunization: Secondary | ICD-10-CM
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
## 2016-01-16 ENCOUNTER — Other Ambulatory Visit: Payer: Self-pay

## 2016-01-16 ENCOUNTER — Telehealth: Payer: Self-pay

## 2016-01-16 DIAGNOSIS — R772 Abnormality of alphafetoprotein: Secondary | ICD-10-CM

## 2016-01-16 DIAGNOSIS — B182 Chronic viral hepatitis C: Secondary | ICD-10-CM

## 2016-01-16 DIAGNOSIS — K769 Liver disease, unspecified: Secondary | ICD-10-CM

## 2016-01-16 NOTE — Telephone Encounter (Signed)
Pt returned my call regarding lab results and Dr. Allen Norris requesting pt to have MRI of the liver. Order has been placed. Advised pt central scheduling will contact him to schedule.

## 2016-01-16 NOTE — Telephone Encounter (Deleted)
-----   Message from Lucilla Lame, MD sent at 01/15/2016 10:27 AM EDT ----- The patient's tumor marker was elevated and he should be set up for an MRI of the liver to look for any lesions.

## 2016-01-16 NOTE — Telephone Encounter (Signed)
-----   Message from Lucilla Lame, MD sent at 01/15/2016 10:27 AM EDT ----- The patient's tumor marker was elevated and he should be set up for an MRI of the liver to look for any lesions.

## 2016-01-16 NOTE — Telephone Encounter (Signed)
Tried contacting pt to inform of lab results. No vm set up. Left vm with Kennyth Lose to have pt return my call.

## 2016-01-21 LAB — NGI HBV ULTRAQUAL

## 2016-01-21 LAB — ALPHA-1-ANTITRYPSIN: A-1 Antitrypsin: 157 mg/dL (ref 90–200)

## 2016-01-21 LAB — AFP TUMOR MARKER: AFP TUMOR MARKER: 60.9 ng/mL — AB (ref 0.0–8.3)

## 2016-01-21 LAB — ANTI-SMOOTH MUSCLE ANTIBODY, IGG: Smooth Muscle Ab: 38 Units — ABNORMAL HIGH (ref 0–19)

## 2016-01-21 LAB — MITOCHONDRIAL ANTIBODIES: Mitochondrial Ab: 2.9 Units (ref 0.0–20.0)

## 2016-01-21 LAB — CERULOPLASMIN: CERULOPLASMIN: 26.7 mg/dL (ref 16.0–31.0)

## 2016-01-23 ENCOUNTER — Other Ambulatory Visit: Payer: Self-pay

## 2016-01-23 DIAGNOSIS — K769 Liver disease, unspecified: Secondary | ICD-10-CM

## 2016-01-23 DIAGNOSIS — B182 Chronic viral hepatitis C: Secondary | ICD-10-CM

## 2016-01-25 ENCOUNTER — Ambulatory Visit (INDEPENDENT_AMBULATORY_CARE_PROVIDER_SITE_OTHER): Payer: Medicaid Other | Admitting: Family Medicine

## 2016-01-25 ENCOUNTER — Encounter: Payer: Self-pay | Admitting: Family Medicine

## 2016-01-25 ENCOUNTER — Other Ambulatory Visit: Payer: Self-pay | Admitting: Family Medicine

## 2016-01-25 VITALS — BP 137/86 | HR 73 | Temp 98.3°F | Wt 220.0 lb

## 2016-01-25 DIAGNOSIS — F419 Anxiety disorder, unspecified: Secondary | ICD-10-CM

## 2016-01-25 DIAGNOSIS — B182 Chronic viral hepatitis C: Secondary | ICD-10-CM

## 2016-01-25 DIAGNOSIS — B181 Chronic viral hepatitis B without delta-agent: Secondary | ICD-10-CM

## 2016-01-25 DIAGNOSIS — Z23 Encounter for immunization: Secondary | ICD-10-CM

## 2016-01-25 DIAGNOSIS — F329 Major depressive disorder, single episode, unspecified: Secondary | ICD-10-CM | POA: Diagnosis not present

## 2016-01-25 DIAGNOSIS — F32A Depression, unspecified: Secondary | ICD-10-CM

## 2016-01-25 NOTE — Assessment & Plan Note (Signed)
Stable at this time. Not causing any issues. Will hold on seeing psychiatry at this time.

## 2016-01-25 NOTE — Progress Notes (Signed)
BP 137/86 mmHg  Pulse 73  Temp(Src) 98.3 F (36.8 C)  Wt 220 lb (99.791 kg)  SpO2 97%   Subjective:    Patient ID: Jimmy Simmons, male    DOB: 29-Jul-1961, 55 y.o.   MRN: [id number]  HPI: Jimmy Simmons is a 55 y.o. male  Chief Complaint  Patient presents with  . Follow-up    hepatitis   Seeing GI- saw them about 2 weeks ago. They checked for other causes of abnormal liver enzymes and he had an elevated tumor marker. He is going to have a MRI to look for any lesions. He is also going to start treatment for the hep c. He states that his mood has been doing OK. Never called the psychiatrist, feels like he doesn't need to right now. Has seen the vein doctor and is getting worked up with them. Notes that his legs have been doing much better since starting the magnesium. Otherwise feeling well with no other concerns or complaints at this time.   Relevant past medical, surgical, family and social history reviewed and updated as indicated. Interim medical history since our last visit reviewed. Allergies and medications reviewed and updated.  Review of Systems  Constitutional: Negative.   Respiratory: Negative.   Cardiovascular: Negative.   Gastrointestinal: Negative.   Psychiatric/Behavioral: Negative.     Per HPI unless specifically indicated above     Objective:    BP 137/86 mmHg  Pulse 73  Temp(Src) 98.3 F (36.8 C)  Wt 220 lb (99.791 kg)  SpO2 97%  Wt Readings from Last 3 Encounters:  01/25/16 220 lb (99.791 kg)  01/07/16 224 lb (101.606 kg)  12/06/15 225 lb (102.059 kg)    Physical Exam  Constitutional: He is oriented to person, place, and time. He appears well-developed and well-nourished. No distress.  HENT:  Head: Normocephalic and atraumatic.  Right Ear: Hearing normal.  Left Ear: Hearing normal.  Nose: Nose normal.  Eyes: Conjunctivae and lids are normal. Right eye exhibits no discharge. Left eye exhibits no discharge. No scleral icterus.  Cardiovascular: Normal rate,  regular rhythm, normal heart sounds and intact distal pulses.  Exam reveals no gallop and no friction rub.   No murmur heard. Pulmonary/Chest: Effort normal and breath sounds normal. No respiratory distress. He has no wheezes. He has no rales. He exhibits no tenderness.  Musculoskeletal: Normal range of motion.  Neurological: He is alert and oriented to person, place, and time.  Skin: Skin is warm, dry and intact. No rash noted. He is not diaphoretic. No erythema. No pallor.  Psychiatric: He has a normal mood and affect. His speech is normal and behavior is normal. Judgment and thought content normal. Cognition and memory are normal.  Nursing note and vitals reviewed.   Results for orders placed or performed in visit on 01/07/16  Ceruloplasmin  Result Value Ref Range   Ceruloplasmin 26.7 16.0 - 31.0 mg/dL  NGI HBV UltraQual  Result Value Ref Range   NGI HBV UltraQual Comment    PCR AMPLIFICATION+DETECTION Comment   AFP tumor marker  Result Value Ref Range   AFP-Tumor Marker 60.9 (H) 0.0 - 8.3 ng/mL  Anti-smooth muscle antibody, IgG  Result Value Ref Range   Smooth Muscle Ab 38 (H) 0 - 19 Units  Mitochondrial antibodies  Result Value Ref Range   Mitochondrial Ab 2.9 0.0 - 20.0 Units  Alpha-1-antitrypsin  Result Value Ref Range   A-1 Antitrypsin 157 90 - 200 mg/dL      Assessment &  Plan:   Problem List Items Addressed This Visit      Digestive   Hepatitis C - Primary    Following with GI. Hep A#1 given 2 months ago, due for #2 in 4 months. Hep B#1 given today, due for #2 in 1 month and #3 in 6 months. Awaiting MRI of his liver to look for lesions with elevated tumor marker. Follow up with GI as needed.       Relevant Orders   Hepatitis A vaccine adult IM   Hepatitis B    GI stated that his antibody is not high enough to confirm immunity on him and he needs vaccination. Hep B #1 given today- will return in 1 and 6 months for vaccines #2 and #3. Continue to follow with GI.  Call with any concerns.         Other   Anxiety    Stable at this time. Not causing any issues. Will hold on seeing psychiatry at this time.       Depression    Stable at this time. Not causing any issues. Will hold on seeing psychiatry at this time.        Other Visit Diagnoses    Need for hepatitis B vaccination        Hep B #1 given today.    Immunization due        Hep B #1 given today.    Relevant Orders    Hepatitis A vaccine adult IM        Follow up plan: Return 1 month nurse visit, 6 months with me.

## 2016-01-25 NOTE — Assessment & Plan Note (Signed)
Following with GI. Hep A#1 given 2 months ago, due for #2 in 4 months. Hep B#1 given today, due for #2 in 1 month and #3 in 6 months. Awaiting MRI of his liver to look for lesions with elevated tumor marker. Follow up with GI as needed.

## 2016-01-25 NOTE — Assessment & Plan Note (Signed)
GI stated that his antibody is not high enough to confirm immunity on him and he needs vaccination. Hep B #1 given today- will return in 1 and 6 months for vaccines #2 and #3. Continue to follow with GI. Call with any concerns.

## 2016-01-30 ENCOUNTER — Telehealth: Payer: Self-pay | Admitting: Gastroenterology

## 2016-01-30 NOTE — Telephone Encounter (Signed)
Paperwork for Hep C medication completed and faxed to [employer]. Waiting on approval.

## 2016-01-30 NOTE — Telephone Encounter (Signed)
Patient called to let you know that he hasn't received a call regarding his MRI/liver

## 2016-01-30 NOTE — Telephone Encounter (Signed)
Pt scheduled for MRI Liver w/wo contrast at [facility] on Tuesday, June 13th at 9:00am. Pt instructed on being NPO 4 hours prior and to arrive at the [facility] at 8:30am.

## 2016-02-19 ENCOUNTER — Ambulatory Visit
Admission: RE | Admit: 2016-02-19 | Discharge: 2016-02-19 | Disposition: A | Discharge status: Home / Self Care | Payer: Medicaid Other | Source: Ambulatory Visit | Attending: Gastroenterology | Admitting: Gastroenterology

## 2016-02-19 DIAGNOSIS — B182 Chronic viral hepatitis C: Secondary | ICD-10-CM | POA: Diagnosis present

## 2016-02-19 DIAGNOSIS — K769 Liver disease, unspecified: Secondary | ICD-10-CM

## 2016-02-19 DIAGNOSIS — K7689 Other specified diseases of liver: Secondary | ICD-10-CM | POA: Insufficient documentation

## 2016-02-19 MED ORDER — GADOBENATE DIMEGLUMINE 529 MG/ML IV SOLN
20.0000 mL | Freq: Once | INTRAVENOUS | Status: AC | PRN
  Administered 2016-02-19: 20 mL via INTRAVENOUS

## 2016-02-20 ENCOUNTER — Telehealth: Payer: Self-pay

## 2016-02-20 NOTE — Telephone Encounter (Signed)
Pt notified of MRI results

## 2016-02-20 NOTE — Telephone Encounter (Signed)
-----   Message from Lucilla Lame, MD sent at 02/19/2016 12:35 PM EDT ----- Let the patient know that his MRI did not show any lesions consistent with liver cancer. He should have his alpha-fetoprotein checked again in 1 month.

## 2016-02-25 ENCOUNTER — Ambulatory Visit: Payer: Medicaid Other

## 2016-02-28 ENCOUNTER — Ambulatory Visit (INDEPENDENT_AMBULATORY_CARE_PROVIDER_SITE_OTHER): Payer: Medicaid Other

## 2016-02-28 DIAGNOSIS — Z23 Encounter for immunization: Secondary | ICD-10-CM | POA: Diagnosis not present

## 2016-03-06 ENCOUNTER — Ambulatory Visit: Payer: Medicaid Other

## 2016-04-07 ENCOUNTER — Other Ambulatory Visit: Payer: Self-pay

## 2016-04-07 DIAGNOSIS — B182 Chronic viral hepatitis C: Secondary | ICD-10-CM

## 2016-05-19 ENCOUNTER — Encounter: Payer: Self-pay | Admitting: Gastroenterology

## 2016-05-19 ENCOUNTER — Ambulatory Visit (INDEPENDENT_AMBULATORY_CARE_PROVIDER_SITE_OTHER): Payer: Medicaid Other | Admitting: Gastroenterology

## 2016-05-19 ENCOUNTER — Other Ambulatory Visit: Payer: Self-pay

## 2016-05-19 VITALS — BP 117/77 | HR 81 | Temp 98.1°F | Ht 67.0 in | Wt 212.0 lb

## 2016-05-19 DIAGNOSIS — B182 Chronic viral hepatitis C: Secondary | ICD-10-CM

## 2016-05-19 NOTE — Progress Notes (Signed)
   Primary Care Physician: Park Liter, DO  Primary Gastroenterologist:  Dr. Lucilla Lame  Chief Complaint  Patient presents with  . Follow up Hepatitis C    Treatment completed    HPI: Jimmy Simmons is a 55 y.o. male here For follow-up after completing his treatment for hepatitis C. The patient reports that he took the medication as directed and did not miss any doses. The patient was known to have fibrosis score as of 3 and 4. The patient states he feels better now than he did prior to treatment. He denied have any side effects while taking the medication.  No current outpatient prescriptions on file.   No current facility-administered medications for this visit.     Allergies as of 05/19/2016  . (No Known Allergies)    ROS:  General: Negative for anorexia, weight loss, fever, chills, fatigue, weakness. ENT: Negative for hoarseness, difficulty swallowing , nasal congestion. CV: Negative for chest pain, angina, palpitations, dyspnea on exertion, peripheral edema.  Respiratory: Negative for dyspnea at rest, dyspnea on exertion, cough, sputum, wheezing.  GI: See history of present illness. GU:  Negative for dysuria, hematuria, urinary incontinence, urinary frequency, nocturnal urination.  Endo: Negative for unusual weight change.    Physical Examination:   BP 117/77   Pulse 81   Temp 98.1 F (36.7 C) (Oral)   Ht 5\' 7"  (1.702 m)   Wt 212 lb (96.2 kg)   BMI 33.20 kg/m   General: Well-nourished, well-developed in no acute distress.  Eyes: No icterus. Conjunctivae pink. Neuro: Alert and oriented x 3.  Grossly intact. Skin: Warm and dry, no jaundice.   Psych: Alert and cooperative, normal mood and affect.  Labs:    Imaging Studies: No results found.  Assessment and Plan:   Jimmy Simmons is a 56 y.o. y/o male who was treated for hepatitis C. The patient states he is feeling well now. The patient will have his labs checked for alpha-fetoprotein which has been elevated in  the past but his MRI did not show any sign of any liver lesions. The patient will also have his hepatitis C viral load and LFTs checked. Will need a repeat ultrasound in December.   Note: This dictation was prepared with Dragon dictation along with smaller phrase technology. Any transcriptional errors that result from this process are unintentional.

## 2016-05-27 LAB — HCV RT-PCR, QUANT (NON-GRAPH)
HCV QUANT: 2500000 {copies}/mL
HCV QUANTITATIVE BASELINE: 1000000 IU/mL
HCV Quantitative Log: 6.398 Log10copy/mL
IU log10: 6 Log10 IU/mL

## 2016-05-27 LAB — HEPATIC FUNCTION PANEL
ALK PHOS: 78 IU/L (ref 39–117)
ALT: 65 IU/L — ABNORMAL HIGH (ref 0–44)
AST: 50 IU/L — ABNORMAL HIGH (ref 0–40)
Albumin: 4.2 g/dL (ref 3.5–5.5)
BILIRUBIN TOTAL: 0.5 mg/dL (ref 0.0–1.2)
BILIRUBIN, DIRECT: 0.19 mg/dL (ref 0.00–0.40)
TOTAL PROTEIN: 7 g/dL (ref 6.0–8.5)

## 2016-05-27 LAB — AFP TUMOR MARKER: AFP-Tumor Marker: 5.4 ng/mL (ref 0.0–8.3)

## 2016-05-28 ENCOUNTER — Telehealth: Payer: Self-pay

## 2016-05-28 ENCOUNTER — Other Ambulatory Visit: Payer: Self-pay

## 2016-05-28 NOTE — Telephone Encounter (Signed)
Tried contacting pt to set up follow up appt. VM was not set up.

## 2016-05-28 NOTE — Telephone Encounter (Signed)
-----   Message from Lucilla Lame, MD sent at 05/27/2016 11:48 AM EDT ----- Please have the patient come in for a follow up.

## 2016-05-28 NOTE — Telephone Encounter (Signed)
-----   Message from Lucilla Lame, MD sent at 05/20/2016 10:29 AM EDT ----- Let the patient know that his liver enzymes are still elevated but much better than they were before. The patient's tumor marker from his previous level has gone back to normal.

## 2016-05-29 NOTE — Telephone Encounter (Signed)
Pt notified of Hep C treatment failure. Follow up appt has been scheduled for 07/02/16.

## 2016-05-29 NOTE — Telephone Encounter (Signed)
-----   Message from Lucilla Lame, MD sent at 05/20/2016 10:29 AM EDT ----- Let the patient know that his liver enzymes are still elevated but much better than they were before. The patient's tumor marker from his previous level has gone back to normal.

## 2016-07-02 ENCOUNTER — Ambulatory Visit: Payer: Self-pay | Admitting: Gastroenterology

## 2016-07-04 ENCOUNTER — Ambulatory Visit: Payer: Medicaid Other

## 2016-07-17 ENCOUNTER — Encounter: Payer: Self-pay | Admitting: Gastroenterology

## 2016-07-17 ENCOUNTER — Ambulatory Visit (INDEPENDENT_AMBULATORY_CARE_PROVIDER_SITE_OTHER): Payer: Medicaid Other | Admitting: Gastroenterology

## 2016-07-17 VITALS — BP 135/87 | HR 82 | Temp 98.4°F | Ht 67.0 in | Wt 224.0 lb

## 2016-07-17 DIAGNOSIS — B182 Chronic viral hepatitis C: Secondary | ICD-10-CM | POA: Diagnosis not present

## 2016-07-17 NOTE — Progress Notes (Signed)
   Primary Care Physician: Park Liter, DO  Primary Gastroenterologist:  Dr. Lucilla Lame  Chief Complaint  Patient presents with  . Hepatitis C    Treatment failure    HPI: Jimmy Simmons is a 55 y.o. male here for follow-up after completing his treatment for hepatitis C. The patient had a positive viral load after treatment. The patient now reports that he took the medication as prescribed and reports that he never missed a dose. The patient had an elevated alpha-fetoprotein and had an MRI which was suggestive of cirrhosis. He shouldn't was treated with by Lawernce Ion previously.  No current outpatient prescriptions on file.   No current facility-administered medications for this visit.     Allergies as of 07/17/2016  . (No Known Allergies)    ROS:  General: Negative for anorexia, weight loss, fever, chills, fatigue, weakness. ENT: Negative for hoarseness, difficulty swallowing , nasal congestion. CV: Negative for chest pain, angina, palpitations, dyspnea on exertion, peripheral edema.  Respiratory: Negative for dyspnea at rest, dyspnea on exertion, cough, sputum, wheezing.  GI: See history of present illness. GU:  Negative for dysuria, hematuria, urinary incontinence, urinary frequency, nocturnal urination.  Endo: Negative for unusual weight change.    Physical Examination:   BP 135/87   Pulse 82   Temp 98.4 F (36.9 C) (Oral)   Ht 5\' 7"  (1.702 m)   Wt 224 lb (101.6 kg)   BMI 35.08 kg/m   General: Well-nourished, well-developed in no acute distress.  Eyes: No icterus. Conjunctivae pink. Mouth: Oropharyngeal mucosa moist and pink , no lesions erythema or exudate. Lungs: Clear to auscultation bilaterally. Non-labored. Heart: Regular rate and rhythm, no murmurs rubs or gallops.  Abdomen: Bowel sounds are normal, nontender, nondistended, no hepatosplenomegaly or masses, no abdominal bruits or hernia , no rebound or guarding.   Extremities: No lower extremity edema. No  clubbing or deformities. Neuro: Alert and oriented x 3.  Grossly intact. Skin: Warm and dry, no jaundice.   Psych: Alert and cooperative, normal mood and affect.  Labs:    Imaging Studies: No results found.  Assessment and Plan:   Jimmy Simmons is a 54 y.o. y/o male who was a treatment failure for his hepatitis C. The patient will be retreated with something different than what he was previously treated with. The patient will be ordered Vosevi. The patient has been explained the plan and agrees with it.    Lucilla Lame, MD. Marval Regal   Note: This dictation was prepared with Dragon dictation along with smaller phrase technology. Any transcriptional errors that result from this process are unintentional.

## 2016-07-25 ENCOUNTER — Encounter: Payer: Self-pay | Admitting: Family Medicine

## 2016-07-28 ENCOUNTER — Telehealth: Payer: Self-pay | Admitting: Gastroenterology

## 2016-07-28 NOTE — Telephone Encounter (Signed)
Patient said to tell you When will they start. He said you will know what he is talking about and wouldn't say anything else.

## 2016-07-29 NOTE — Telephone Encounter (Signed)
Pt advised insurance is still in process with his Hep C treatment. Will contact him as soon as we get the approval.

## 2016-07-30 ENCOUNTER — Ambulatory Visit (INDEPENDENT_AMBULATORY_CARE_PROVIDER_SITE_OTHER): Payer: Medicaid Other

## 2016-07-30 DIAGNOSIS — B182 Chronic viral hepatitis C: Secondary | ICD-10-CM | POA: Diagnosis not present

## 2016-07-30 DIAGNOSIS — Z23 Encounter for immunization: Secondary | ICD-10-CM | POA: Diagnosis not present

## 2016-08-05 ENCOUNTER — Ambulatory Visit (INDEPENDENT_AMBULATORY_CARE_PROVIDER_SITE_OTHER): Payer: Self-pay | Admitting: Vascular Surgery

## 2016-08-08 ENCOUNTER — Ambulatory Visit: Payer: Medicaid Other | Admitting: Family Medicine

## 2016-08-22 ENCOUNTER — Ambulatory Visit (INDEPENDENT_AMBULATORY_CARE_PROVIDER_SITE_OTHER): Payer: Medicaid Other | Admitting: Vascular Surgery

## 2016-08-22 ENCOUNTER — Encounter (INDEPENDENT_AMBULATORY_CARE_PROVIDER_SITE_OTHER): Payer: Self-pay | Admitting: Vascular Surgery

## 2016-08-22 VITALS — BP 142/83 | HR 68 | Resp 16 | Ht 69.0 in | Wt 223.0 lb

## 2016-08-22 DIAGNOSIS — I83812 Varicose veins of left lower extremities with pain: Secondary | ICD-10-CM | POA: Insufficient documentation
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
## 2016-08-22 NOTE — Assessment & Plan Note (Signed)
See below

## 2016-10-16 IMAGING — US US ABDOMEN LIMITED
1 series · 12 of 12 positions shown · non-contrast
Comparison: None.

CLINICAL DATA: Hepatitis-C.

EXAM:
US ABDOMEN LIMITED - RIGHT UPPER QUADRANT
ULTRASOUND HEPATIC ELASTOGRAPHY
TECHNIQUE: Limited right upper quadrant abdominal ultrasound was performed. In
addition, ultrasound elastography evaluation of the liver was
performed. A region of interest was placed in the right lobe of the
liver. Following application of a compressive sonographic pulse,
shear waves were detected in the adjacent hepatic tissue and the
shear wave velocity was calculated. Multiple assessments were
performed at the selected site. Median shear wave velocity is
correlated to a Metavir fibrosis score.

[Series 1: us abdomen limited · 0.25mm/px · 12 of 12 slices shown]
[im 1/12]
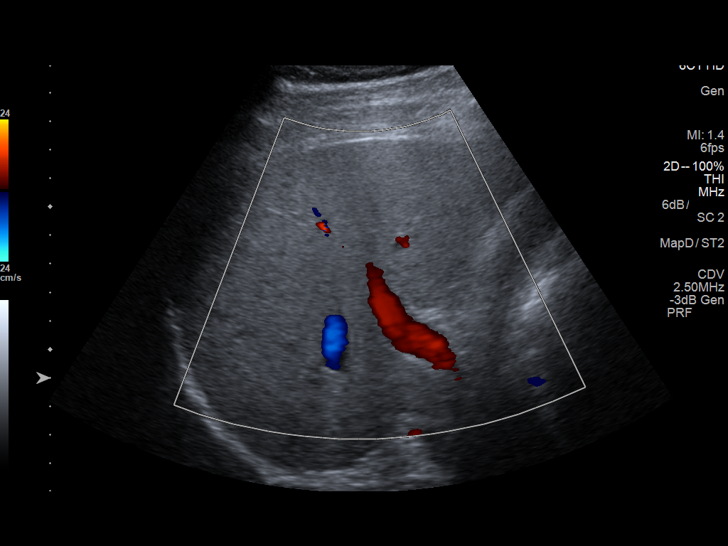
[im 2/12]
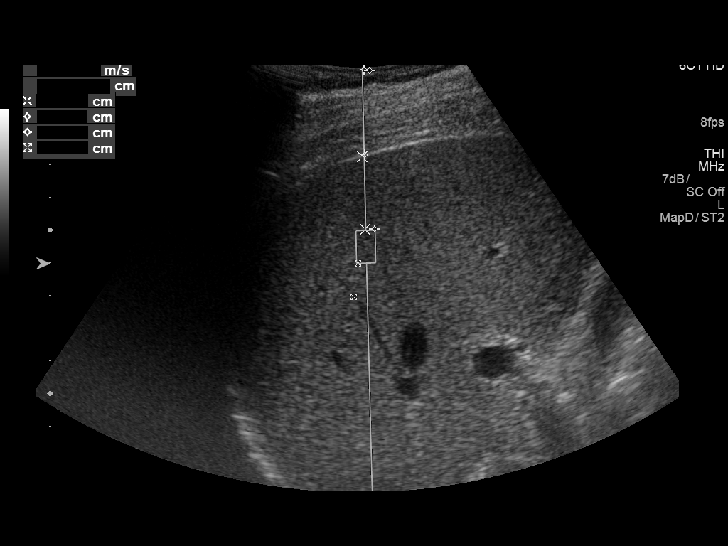
[im 3/12]
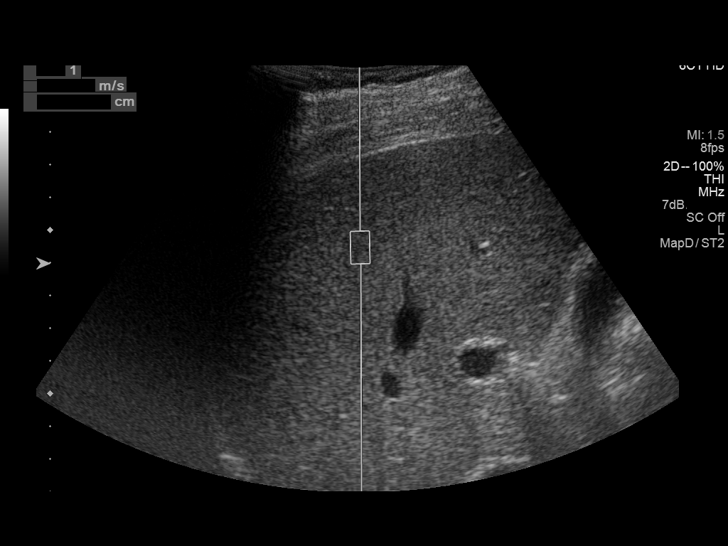
[im 4/12]
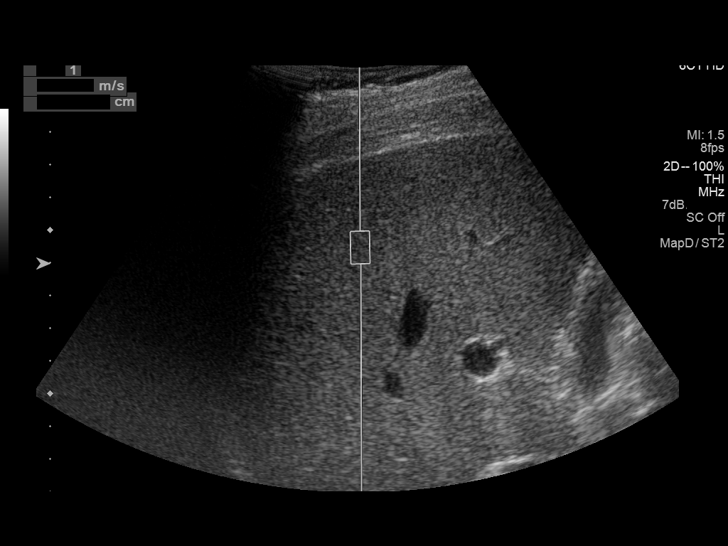
[im 5/12]
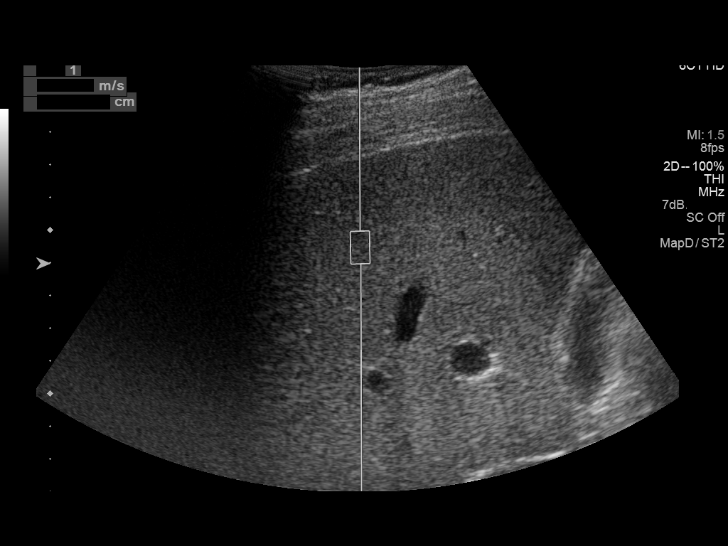
[im 6/12]
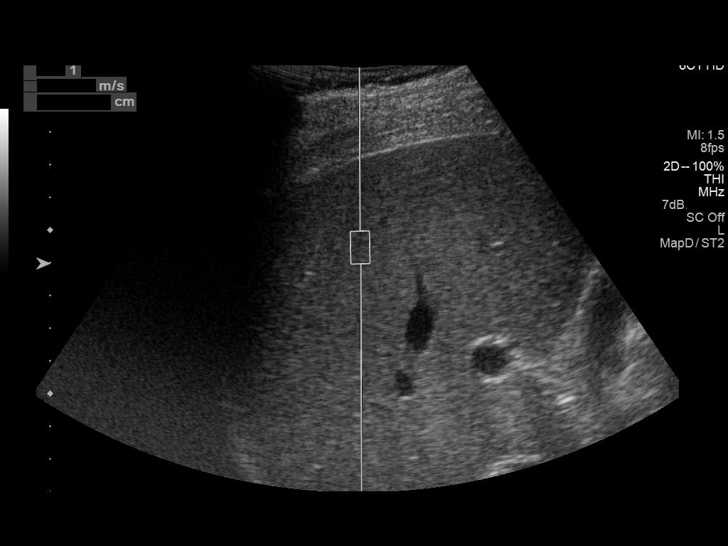
[im 7/12]
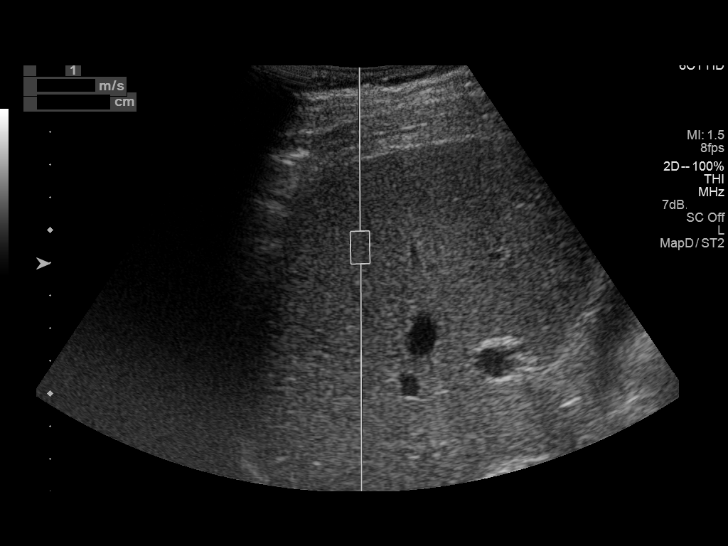
[im 8/12]
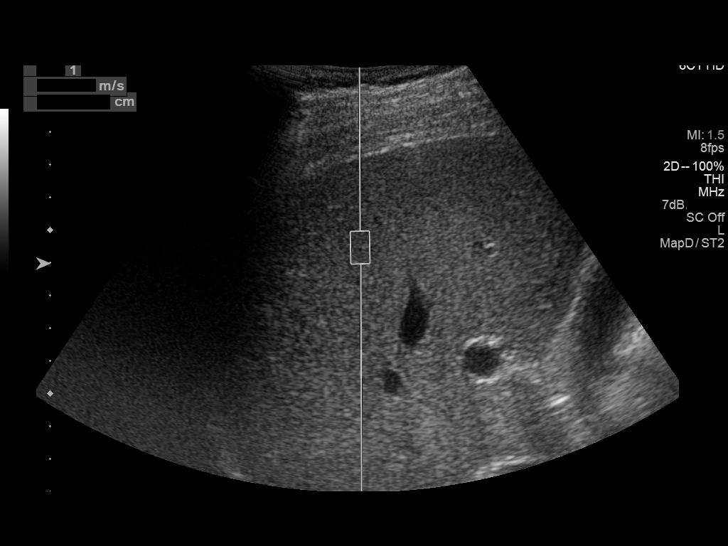
[im 9/12]
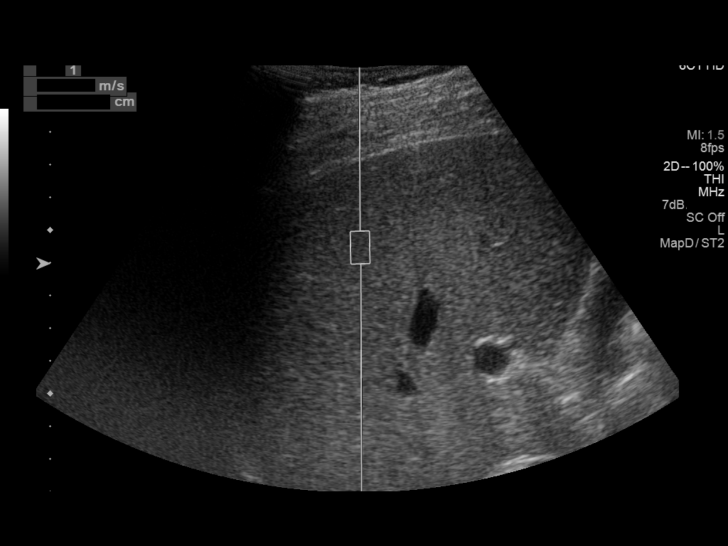
[im 10/12]
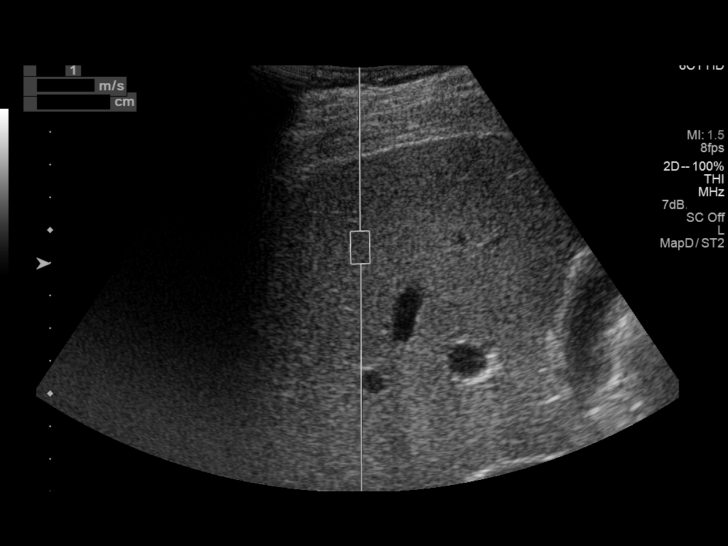
[im 11/12]
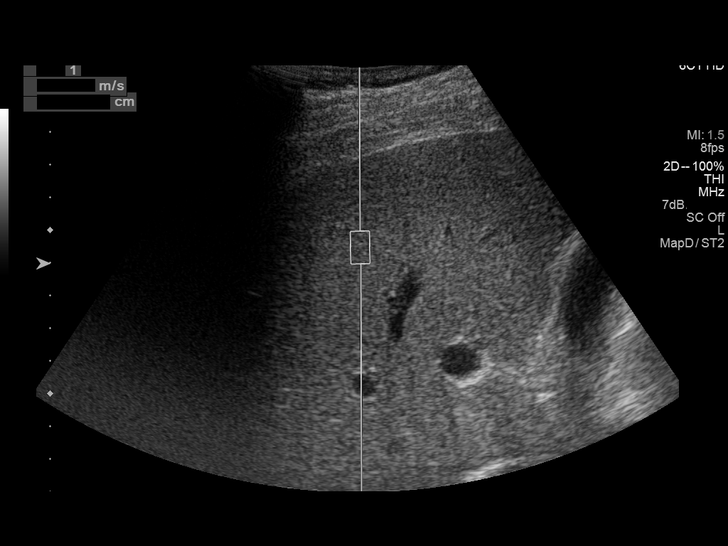
[im 12/12]
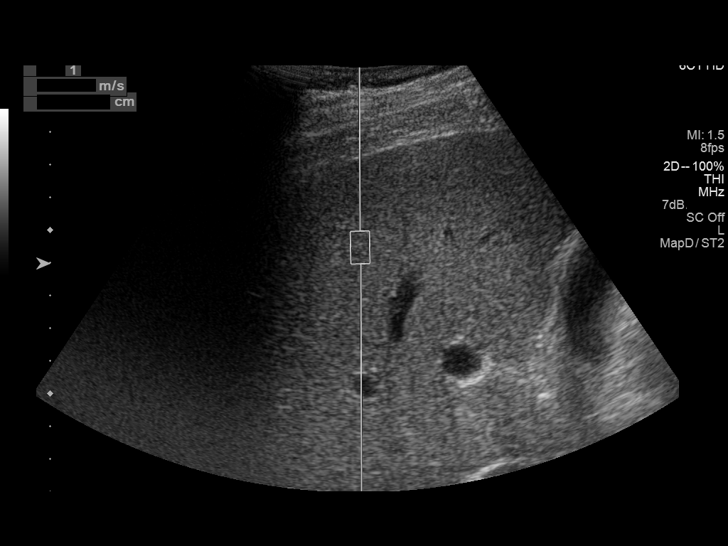

[12 of 12 positions shown; findings below may reference images not displayed]

FINDINGS: ULTRASOUND ABDOMEN LIMITED RIGHT UPPER QUADRANT

Gallbladder: Somewhat contracted gallbladder with no cholelithiasis,
pericholecystic fluid or sonographic Murphy sign. Apparent mild
gallbladder wall thickening is favored to be due to the contracted
state.

Common bile duct: Diameter: 4 mm

Liver: Minimally complex 0.6 x 0.7 x 0.8 cm liver cyst in the
posterior right liver lobe with thin internal septation. No
additional liver lesions. Background liver parenchyma appears mildly
coarsened in echotexture. Liver parenchymal echogenicity is at the
upper limits of normal. No definite liver surface irregularity.

ULTRASOUND HEPATIC ELASTOGRAPHY

Device: Siemens Helix VQ

Patient position:  Supine

Transducer:  [id number]

Number of measurements:  10

Hepatic Segment:  7

Median velocity:   3.47  m/sec

IQR:

IQR/Median velocity ratio:

Corresponding Metavir fibrosis score:  Some F3 + F4

Risk of fibrosis: High

Limitations of exam: None

Pertinent findings noted on other imaging exams:  None

Please note that abnormal shear wave velocities may also be
identified in clinical settings other than with hepatic fibrosis,
such as: acute hepatitis, elevated right heart and central venous
pressures including use of beta blockers, Samalove disease
(Jim), infiltrative processes such as
mastocytosis/amyloidosis/infiltrative tumor, extrahepatic
cholestasis, in the post-prandial state, and liver transplantation.
Correlation with patient history, laboratory data, and clinical
condition recommended.
IMPRESSION: 1. Mildly coarsened liver parenchyma, a nonspecific finding that
could indicate hepatic fibrosis. No macroscopic evidence of
cirrhosis.
2. Benign-appearing minimally complex septated subcentimeter liver
cyst. No suspicious liver masses.
3. Contracted gallbladder without cholelithiasis, which could
indicate biliary dyskinesia in this reportedly fasting patient. No
biliary ductal dilatation.
4. Hepatic elastography results:
Median hepatic shear wave velocity is calculated at 3.47 m/sec.
Corresponding Metavir fibrosis score is Some F3 + F4.
Risk of fibrosis is high.

Follow-up:  Follow up advised.

## 2016-10-30 ENCOUNTER — Other Ambulatory Visit: Payer: Self-pay

## 2016-10-30 DIAGNOSIS — B182 Chronic viral hepatitis C: Secondary | ICD-10-CM

## 2016-11-01 LAB — HEPATIC FUNCTION PANEL
ALK PHOS: 78 IU/L (ref 39–117)
ALT: 79 IU/L — AB (ref 0–44)
AST: 69 IU/L — AB (ref 0–40)
Albumin: 4.1 g/dL (ref 3.5–5.5)
BILIRUBIN, DIRECT: 0.08 mg/dL (ref 0.00–0.40)
Bilirubin Total: 0.4 mg/dL (ref 0.0–1.2)
Total Protein: 6.9 g/dL (ref 6.0–8.5)

## 2016-11-03 ENCOUNTER — Telehealth: Payer: Self-pay | Admitting: Gastroenterology

## 2016-11-03 NOTE — Telephone Encounter (Signed)
Patient left a voice message that he didn't receive his last order of medication.

## 2016-11-04 NOTE — Telephone Encounter (Signed)
Tried contacting pt but vm was not set up.  

## 2016-11-06 NOTE — Telephone Encounter (Signed)
Pt stated he was having issues with his Medicaid. Will contact them today to see if they get it straightened out.

## 2016-11-07 LAB — HCV RT-PCR, QUANT (NON-GRAPH)
HCV QUANT LOG: 3.531 {Log_copies}/mL
HCV QUANT: 3400 {copies}/mL
HCV QUANTITATIVE BASELINE: 1360 IU/mL
IU log10: 3.134 Log10 IU/mL

## 2016-11-17 ENCOUNTER — Telehealth: Payer: Self-pay

## 2016-11-17 NOTE — Telephone Encounter (Signed)
-----   Message from Lucilla Lame, MD sent at 11/05/2016  9:27 AM EST ----- The patient know that his liver enzymes are still elevated. Has the patient been restarted on a treatment?

## 2016-11-17 NOTE — Telephone Encounter (Signed)
Pt notified of lab results

## 2017-01-27 ENCOUNTER — Encounter: Payer: Self-pay | Admitting: Family Medicine

## 2017-01-27 ENCOUNTER — Ambulatory Visit (INDEPENDENT_AMBULATORY_CARE_PROVIDER_SITE_OTHER): Payer: Medicaid Other | Admitting: Family Medicine

## 2017-01-27 VITALS — BP 131/76 | HR 71 | Temp 98.4°F | Ht 68.3 in | Wt 228.1 lb

## 2017-01-27 DIAGNOSIS — R05 Cough: Secondary | ICD-10-CM | POA: Diagnosis not present

## 2017-01-27 DIAGNOSIS — J449 Chronic obstructive pulmonary disease, unspecified: Secondary | ICD-10-CM | POA: Insufficient documentation

## 2017-01-27 DIAGNOSIS — F419 Anxiety disorder, unspecified: Secondary | ICD-10-CM

## 2017-01-27 DIAGNOSIS — H269 Unspecified cataract: Secondary | ICD-10-CM | POA: Diagnosis not present

## 2017-01-27 DIAGNOSIS — Z720 Tobacco use: Secondary | ICD-10-CM | POA: Diagnosis not present

## 2017-01-27 DIAGNOSIS — B181 Chronic viral hepatitis B without delta-agent: Secondary | ICD-10-CM | POA: Diagnosis not present

## 2017-01-27 DIAGNOSIS — Z125 Encounter for screening for malignant neoplasm of prostate: Secondary | ICD-10-CM

## 2017-01-27 DIAGNOSIS — Z13220 Encounter for screening for lipoid disorders: Secondary | ICD-10-CM

## 2017-01-27 DIAGNOSIS — B182 Chronic viral hepatitis C: Secondary | ICD-10-CM | POA: Diagnosis not present

## 2017-01-27 DIAGNOSIS — R059 Cough, unspecified: Secondary | ICD-10-CM

## 2017-01-27 MED ORDER — ALBUTEROL SULFATE HFA 108 (90 BASE) MCG/ACT IN AERS: 2.0000 | INHALATION_SPRAY | Freq: Four times a day (QID) | RESPIRATORY_TRACT | 0 refills | Status: DC | PRN

## 2017-01-27 MED ORDER — FLUTICASONE-SALMETEROL 250-50 MCG/DOSE IN AEPB: 1.0000 | INHALATION_SPRAY | Freq: Two times a day (BID) | RESPIRATORY_TRACT | 3 refills | Status: DC

## 2017-01-27 MED ORDER — NICOTINE 14 MG/24HR TD PT24: 14.0000 mg | MEDICATED_PATCH | Freq: Every day | TRANSDERMAL | 1 refills | Status: DC

## 2017-01-27 NOTE — Patient Instructions (Addendum)
Chronic Obstructive Pulmonary Disease Chronic obstructive pulmonary disease (COPD) is a common lung condition in which airflow from the lungs is limited. COPD is a general term that can be used to describe many different lung problems that limit airflow, including both chronic bronchitis and emphysema. If you have COPD, your lung function will probably never return to normal, but there are measures you can take to improve lung function and make yourself feel better. What are the causes?  Smoking (common).  Exposure to secondhand smoke.  Genetic problems.  Chronic inflammatory lung diseases or recurrent infections. What are the signs or symptoms?  Shortness of breath, especially with physical activity.  Deep, persistent (chronic) cough with a large amount of thick mucus.  Wheezing.  Rapid breaths (tachypnea).  Gray or bluish discoloration (cyanosis) of the skin, especially in your fingers, toes, or lips.  Fatigue.  Weight loss.  Frequent infections or episodes when breathing symptoms become much worse (exacerbations).  Chest tightness. How is this diagnosed? Your health care provider will take a medical history and perform a physical examination to diagnose COPD. Additional tests for COPD may include:  Lung (pulmonary) function tests.  Chest X-ray.  CT scan.  Blood tests. How is this treated? Treatment for COPD may include:  Inhaler and nebulizer medicines. These help manage the symptoms of COPD and make your breathing more comfortable.  Supplemental oxygen. Supplemental oxygen is only helpful if you have a low oxygen level in your blood.  Exercise and physical activity. These are beneficial for nearly all people with COPD.  Lung surgery or transplant.  Nutrition therapy to gain weight, if you are underweight.  Pulmonary rehabilitation. This may involve working with a team of health care providers and specialists, such as respiratory, occupational, and physical  therapists. Follow these instructions at home:  Take all medicines (inhaled or pills) as directed by your health care provider.  Avoid over-the-counter medicines or cough syrups that dry up your airway (such as antihistamines) and slow down the elimination of secretions unless instructed otherwise by your health care provider.  If you are a smoker, the most important thing that you can do is stop smoking. Continuing to smoke will cause further lung damage and breathing trouble. Ask your health care provider for help with quitting smoking. He or she can direct you to community resources or hospitals that provide support.  Avoid exposure to irritants such as smoke, chemicals, and fumes that aggravate your breathing.  Use oxygen therapy and pulmonary rehabilitation if directed by your health care provider. If you require home oxygen therapy, ask your health care provider whether you should purchase a pulse oximeter to measure your oxygen level at home.  Avoid contact with individuals who have a contagious illness.  Avoid extreme temperature and humidity changes.  Eat healthy foods. Eating smaller, more frequent meals and resting before meals may help you maintain your strength.  Stay active, but balance activity with periods of rest. Exercise and physical activity will help you maintain your ability to do things you want to do.  Preventing infection and hospitalization is very important when you have COPD. Make sure to receive all the vaccines your health care provider recommends, especially the pneumococcal and influenza vaccines. Ask your health care provider whether you need a pneumonia vaccine.  Learn and use relaxation techniques to manage stress.  Learn and use controlled breathing techniques as directed by your health care provider. Controlled breathing techniques include: 1. Pursed lip breathing. Start by breathing in (inhaling)   through your nose for 1 second. Then, purse your lips as  if you were going to whistle and breathe out (exhale) through the pursed lips for 2 seconds. 2. Diaphragmatic breathing. Start by putting one hand on your abdomen just above your waist. Inhale slowly through your nose. The hand on your abdomen should move out. Then purse your lips and exhale slowly. You should be able to feel the hand on your abdomen moving in as you exhale.  Learn and use controlled coughing to clear mucus from your lungs. Controlled coughing is a series of short, progressive coughs. The steps of controlled coughing are: 1. Lean your head slightly forward. 2. Breathe in deeply using diaphragmatic breathing. 3. Try to hold your breath for 3 seconds. 4. Keep your mouth slightly open while coughing twice. 5. Spit any mucus out into a tissue. 6. Rest and repeat the steps once or twice as needed. Contact a health care provider if:  You are coughing up more mucus than usual.  There is a change in the color or thickness of your mucus.  Your breathing is more labored than usual.  Your breathing is faster than usual. Get help right away if:  You have shortness of breath while you are resting.  You have shortness of breath that prevents you from:  Being able to talk.  Performing your usual physical activities.  You have chest pain lasting longer than 5 minutes.  Your skin color is more cyanotic than usual.  You measure low oxygen saturations for longer than 5 minutes with a pulse oximeter. This information is not intended to replace advice given to you by your health care provider. Make sure you discuss any questions you have with your health care provider. Document Released: 06/04/2005 Document Revised: 01/31/2016 Document Reviewed: 04/21/2013 Elsevier Interactive Patient Education  2017 [employer].  Steps to Quit Smoking Smoking tobacco can be bad for your health. It can also affect almost every organ in your body. Smoking puts you and people around you at risk for  many serious long-lasting (chronic) diseases. Quitting smoking is hard, but it is one of the best things that you can do for your health. It is never too late to quit. What are the benefits of quitting smoking? When you quit smoking, you lower your risk for getting serious diseases and conditions. They can include:  Lung cancer or lung disease.  Heart disease.  Stroke.  Heart attack.  Not being able to have children (infertility).  Weak bones (osteoporosis) and broken bones (fractures). If you have coughing, wheezing, and shortness of breath, those symptoms may get better when you quit. You may also get sick less often. If you are pregnant, quitting smoking can help to lower your chances of having a baby of low birth weight. What can I do to help me quit smoking? Talk with your doctor about what can help you quit smoking. Some things you can do (strategies) include:  Quitting smoking totally, instead of slowly cutting back how much you smoke over a period of time.  Going to in-person counseling. You are more likely to quit if you go to many counseling sessions.  Using resources and support systems, such as:  Online chats with a Social worker.  Phone quitlines.  Printed Furniture conservator/restorer.  Support groups or group counseling.  Text messaging programs.  Mobile phone apps or applications.  Taking medicines. Some of these medicines may have nicotine in them. If you are pregnant or breastfeeding, do not take  any medicines to quit smoking unless your doctor says it is okay. Talk with your doctor about counseling or other things that can help you. Talk with your doctor about using more than one strategy at the same time, such as taking medicines while you are also going to in-person counseling. This can help make quitting easier. What things can I do to make it easier to quit? Quitting smoking might feel very hard at first, but there is a lot that you can do to make it easier. Take  these steps:  Talk to your family and friends. Ask them to support and encourage you.  Call phone quitlines, reach out to support groups, or work with a Social worker.  Ask people who smoke to not smoke around you.  Avoid places that make you want (trigger) to smoke, such as:  Bars.  Parties.  Smoke-break areas at work.  Spend time with people who do not smoke.  Lower the stress in your life. Stress can make you want to smoke. Try these things to help your stress:  Getting regular exercise.  Deep-breathing exercises.  Yoga.  Meditating.  Doing a body scan. To do this, close your eyes, focus on one area of your body at a time from head to toe, and notice which parts of your body are tense. Try to relax the muscles in those areas.  Download or buy apps on your mobile phone or tablet that can help you stick to your quit plan. There are many free apps, such as QuitGuide from the [employer] Office manager for Disease Control and Prevention). You can find more support from smokefree.gov and other websites. This information is not intended to replace advice given to you by your health care provider. Make sure you discuss any questions you have with your health care provider. Document Released: 06/21/2009 Document Revised: 04/22/2016 Document Reviewed: 01/09/2015 Elsevier Interactive Patient Education  2017 [employer].

## 2017-01-27 NOTE — Assessment & Plan Note (Signed)
Will send in patches for him to help him quit smoking. Call with any concerns.

## 2017-01-27 NOTE — Assessment & Plan Note (Signed)
Referral to opthalmology made today. Call with any concerns.  

## 2017-01-27 NOTE — Progress Notes (Signed)
BP 131/76 (BP Location: Left Arm, Patient Position: Sitting, Cuff Size: Normal)   Pulse 71   Temp 98.4 F (36.9 C)   Ht 5' 8.3" (1.735 m)   Wt 228 lb 1.6 oz (103.5 kg)   SpO2 96%   BMI 34.38 kg/m    Subjective:    Patient ID: Jimmy Simmons, male    DOB: [birth year], 56 y.o.   MRN: [id number]  HPI: Jeffrie Lofstrom is a 56 y.o. male  Chief Complaint  Patient presents with  . Follow-up  . Tinnitus    Patient complains of ringing in the ears  . Cataract    Patient has seen an eye doctor and stated he needs surgery for cataract  . Cough    Patient states he wakes up with a cough every morning and has cut back smoking.   HEPATITIS C- treatment failure. Still had +viral load after treatment, was to be retreated, but has not seen his GI since November, was having issues with his medicaid, due to see GI again in about a month Duration since diagnosis: chronic Hep C transmission: IV drug use Genotype: 1a Viral load:  3,400 Hepatology evaluation:yes Liver biopsy:no  Cirrhosis: yes Antiviral therapy:yes Hepatocellular carcinoma screening: yes Esophageal varices screening/EGD: no Hepatitis A Vaccine: Up to Date Hepatitis B Vaccine: Up to Date Pneumovax Vaccine: Up to Date  Has been having tinnitus off and on.   Has a catartact- would like to see an opthalmologist to get this sorted.   COUGH Duration: chronic Circumstances of initial development of cough: unknown Cough severity: moderate Cough description: productive and hacking Aggravating factors:  worse in the AM Alleviating factors: albuterol Status:  worse Treatments attempted: albuterol Wheezing: yes Shortness of breath: yes Chest pain: no Chest tightness:yes Nasal congestion: no Runny nose: no Postnasal drip: no Frequent throat clearing or swallowing: no Hemoptysis: no Fevers: no Night sweats: no Weight loss: no Heartburn: yes Recent foreign travel: no Tuberculosis contacts: no   Relevant past medical,  surgical, family and social history reviewed and updated as indicated. Interim medical history since our last visit reviewed. Allergies and medications reviewed and updated.  Review of Systems  Constitutional: Negative.   Respiratory: Positive for cough, shortness of breath and wheezing. Negative for apnea, choking, chest tightness and stridor.   Cardiovascular: Negative.   Gastrointestinal: Negative.   Psychiatric/Behavioral: Positive for agitation and dysphoric mood. Negative for behavioral problems, confusion, decreased concentration, hallucinations, self-injury, sleep disturbance and suicidal ideas. The patient is nervous/anxious. The patient is not hyperactive.     Per HPI unless specifically indicated above     Objective:    BP 131/76 (BP Location: Left Arm, Patient Position: Sitting, Cuff Size: Normal)   Pulse 71   Temp 98.4 F (36.9 C)   Ht 5' 8.3" (1.735 m)   Wt 228 lb 1.6 oz (103.5 kg)   SpO2 96%   BMI 34.38 kg/m   Wt Readings from Last 3 Encounters:  01/27/17 228 lb 1.6 oz (103.5 kg)  08/22/16 223 lb (101.2 kg)  07/17/16 224 lb (101.6 kg)    Physical Exam  Constitutional: He is oriented to person, place, and time. He appears well-developed and well-nourished. No distress.  HENT:  Head: Normocephalic and atraumatic.  Right Ear: Hearing normal.  Left Ear: Hearing normal.  Nose: Nose normal.  Eyes: Conjunctivae and lids are normal. Right eye exhibits no discharge. Left eye exhibits no discharge. No scleral icterus.  Cardiovascular: Normal rate, regular rhythm, normal heart sounds and  intact distal pulses.  Exam reveals no gallop and no friction rub.   No murmur heard. Pulmonary/Chest: Effort normal. No respiratory distress. He has decreased breath sounds in the right upper field, the right middle field, the right lower field, the left upper field, the left middle field and the left lower field. He has no wheezes. He has no rales. He exhibits no tenderness.  Abdominal:  Soft. Bowel sounds are normal. He exhibits no distension and no mass. There is no tenderness. There is no rebound and no guarding.  Musculoskeletal: Normal range of motion.  Neurological: He is alert and oriented to person, place, and time.  Skin: Skin is warm, dry and intact. No rash noted. He is not diaphoretic. No erythema. No pallor.  Psychiatric: He has a normal mood and affect. His speech is normal and behavior is normal. Judgment and thought content normal. Cognition and memory are normal.  Nursing note and vitals reviewed.   Results for orders placed or performed in visit on 10/30/16  Hepatic function panel  Result Value Ref Range   Total Protein 6.9 6.0 - 8.5 g/dL   Albumin 4.1 3.5 - 5.5 g/dL   Bilirubin Total 0.4 0.0 - 1.2 mg/dL   Bilirubin, Direct 0.08 0.00 - 0.40 mg/dL   Alkaline Phosphatase 78 39 - 117 IU/L   AST 69 (H) 0 - 40 IU/L   ALT 79 (H) 0 - 44 IU/L  HCV RT-PCR, Quant (Non-Graph)  Result Value Ref Range   HCV Quantitative 3,400 Copies/mL   HCV Quantitative Log 3.531 Log10copy/mL   HCV QUANTITATIVE BASELINE 1,360 IU/mL   IU log10 3.134 Log10 IU/mL   PCR AMPLIFICATION+DETECTION Comment    TEST INFORMATION Comment       Assessment & Plan:   Problem List Items Addressed This Visit      Respiratory   COPD (chronic obstructive pulmonary disease) ([place])    Newly diagnosed. Will treat with advair and PRN albuterol. Call with any concerns. Recheck 1 month.       Relevant Medications   albuterol (PROVENTIL HFA;VENTOLIN HFA) 108 (90 Base) MCG/ACT inhaler   Fluticasone-Salmeterol (ADVAIR DISKUS) 250-50 MCG/DOSE AEPB   nicotine (NICODERM CQ) 14 mg/24hr patch     Digestive   Hepatitis C    Seeing Dr. Allen Norris in June. Will check labs today. Await results.      Relevant Orders   CBC with Differential/Platelet   Comprehensive metabolic panel   HCV RNA quant   UA/M w/rflx Culture, Routine   Hepatitis B    Has been vaccinated now. Seeing Dr. Allen Norris in June. Will check  labs today. Await results.      Relevant Orders   CBC with Differential/Platelet   Comprehensive metabolic panel   Hepatitis B surface antibody   UA/M w/rflx Culture, Routine     Other   Anxiety    Not doing great right now. Would like medicine for sleep. Will hold on this right now until we are sure what's going on with his liver. May need to see psychiatry given issues with medications in the past.       Relevant Orders   CBC with Differential/Platelet   Comprehensive metabolic panel   TSH   Cataract of right eye    Referral to opthalmology made today. Call with any concerns.       Relevant Orders   Ambulatory referral to Ophthalmology   Tobacco abuse    Will send in patches for him to help him quit  smoking. Call with any concerns.        Other Visit Diagnoses    Cough    -  Primary   Due to new diangosis of COPD. Will treat that. Call with any concerns.    Relevant Orders   Spirometry with Graph (Completed)   Screening for prostate cancer       Labs checked today. Await results.    Relevant Orders   PSA   Screening for cholesterol level       Labs checked today. Await results.    Relevant Orders   Lipid Panel w/o Chol/HDL Ratio       Follow up plan: Return in about 4 weeks (around 02/24/2017) for follow up breathing.

## 2017-01-27 NOTE — Assessment & Plan Note (Signed)
Has been vaccinated now. Seeing Dr. Allen Norris in June. Will check labs today. Await results.

## 2017-01-27 NOTE — Assessment & Plan Note (Signed)
Newly diagnosed. Will treat with advair and PRN albuterol. Call with any concerns. Recheck 1 month.

## 2017-01-27 NOTE — Assessment & Plan Note (Signed)
Seeing Dr. Allen Norris in June. Will check labs today. Await results.

## 2017-01-27 NOTE — Assessment & Plan Note (Signed)
Not doing great right now. Would like medicine for sleep. Will hold on this right now until we are sure what's going on with his liver. May need to see psychiatry given issues with medications in the past.

## 2017-01-28 LAB — MICROSCOPIC EXAMINATION
BACTERIA UA: NONE SEEN
RBC, UA: NONE SEEN /hpf (ref 0–?)

## 2017-01-28 LAB — COMPREHENSIVE METABOLIC PANEL
ALBUMIN: 4.7 g/dL (ref 3.5–5.5)
ALT: 41 IU/L (ref 0–44)
AST: 32 IU/L (ref 0–40)
Albumin/Globulin Ratio: 1.6 (ref 1.2–2.2)
Alkaline Phosphatase: 80 IU/L (ref 39–117)
BILIRUBIN TOTAL: 0.5 mg/dL (ref 0.0–1.2)
BUN / CREAT RATIO: 17 (ref 9–20)
BUN: 14 mg/dL (ref 6–24)
CHLORIDE: 101 mmol/L (ref 96–106)
CO2: 22 mmol/L (ref 18–29)
CREATININE: 0.81 mg/dL (ref 0.76–1.27)
Calcium: 9.7 mg/dL (ref 8.7–10.2)
GFR calc non Af Amer: 101 mL/min/{1.73_m2} (ref >59)
GFR, EST AFRICAN AMERICAN: 116 mL/min/{1.73_m2} (ref >59)
GLOBULIN, TOTAL: 2.9 g/dL (ref 1.5–4.5)
GLUCOSE: 93 mg/dL (ref 65–99)
Potassium: 4.2 mmol/L (ref 3.5–5.2)
SODIUM: 139 mmol/L (ref 134–144)
TOTAL PROTEIN: 7.6 g/dL (ref 6.0–8.5)

## 2017-01-28 LAB — LIPID PANEL W/O CHOL/HDL RATIO
CHOLESTEROL TOTAL: 120 mg/dL (ref 100–199)
HDL: 32 mg/dL — ABNORMAL LOW (ref >39)
LDL CALC: 58 mg/dL (ref 0–99)
TRIGLYCERIDES: 148 mg/dL (ref 0–149)
VLDL CHOLESTEROL CAL: 30 mg/dL (ref 5–40)

## 2017-01-28 LAB — CBC WITH DIFFERENTIAL/PLATELET
BASOS: 1 %
Basophils Absolute: 0 10*3/uL (ref 0.0–0.2)
EOS (ABSOLUTE): 0.1 10*3/uL (ref 0.0–0.4)
EOS: 1 %
HEMATOCRIT: 43.7 % (ref 37.5–51.0)
HEMOGLOBIN: 15.2 g/dL (ref 13.0–17.7)
IMMATURE GRANS (ABS): 0 10*3/uL (ref 0.0–0.1)
Immature Granulocytes: 0 %
LYMPHS ABS: 3.7 10*3/uL — AB (ref 0.7–3.1)
LYMPHS: 45 %
MCH: 34.2 pg — AB (ref 26.6–33.0)
MCHC: 34.8 g/dL (ref 31.5–35.7)
MCV: 98 fL — AB (ref 79–97)
MONOCYTES: 7 %
Monocytes Absolute: 0.6 10*3/uL (ref 0.1–0.9)
NEUTROS ABS: 4 10*3/uL (ref 1.4–7.0)
Neutrophils: 46 %
Platelets: 100 10*3/uL — CL (ref 150–379)
RBC: 4.44 x10E6/uL (ref 4.14–5.80)
RDW: 13.6 % (ref 12.3–15.4)
WBC: 8.4 10*3/uL (ref 3.4–10.8)

## 2017-01-28 LAB — UA/M W/RFLX CULTURE, ROUTINE
Bilirubin, UA: NEGATIVE
Glucose, UA: NEGATIVE
Ketones, UA: NEGATIVE
Nitrite, UA: NEGATIVE
PH UA: 5 (ref 5.0–7.5)
PROTEIN UA: NEGATIVE
RBC, UA: NEGATIVE
Specific Gravity, UA: 1.015 (ref 1.005–1.030)
Urobilinogen, Ur: 0.2 mg/dL (ref 0.2–1.0)

## 2017-01-28 LAB — HCV RNA QUANT: Hepatitis C Quantitation: NOT DETECTED IU/mL

## 2017-01-28 LAB — PSA: PROSTATE SPECIFIC AG, SERUM: 0.8 ng/mL (ref 0.0–4.0)

## 2017-01-28 LAB — HEPATITIS B SURFACE ANTIBODY, QUANTITATIVE: Hepatitis B Surf Ab Quant: <3.1 m[IU]/mL — ABNORMAL LOW (ref >9.9)

## 2017-01-28 LAB — TSH: TSH: 2.05 u[IU]/mL (ref 0.450–4.500)

## 2017-01-29 ENCOUNTER — Encounter: Payer: Self-pay | Admitting: Family Medicine

## 2017-02-03 ENCOUNTER — Telehealth: Payer: Self-pay | Admitting: Family Medicine

## 2017-02-03 MED ORDER — MOMETASONE FURO-FORMOTEROL FUM 200-5 MCG/ACT IN AERO: 2.0000 | INHALATION_SPRAY | Freq: Two times a day (BID) | RESPIRATORY_TRACT | 3 refills | Status: DC

## 2017-02-03 NOTE — Telephone Encounter (Signed)
Rx sent to his pharmacy

## 2017-02-03 NOTE — Telephone Encounter (Signed)
Jimmy Simmons with [facility] called to see if you could change the Advair to alternative [facility]   [phone]  Thanks

## 2017-02-17 NOTE — Progress Notes (Signed)
    Primary Care Physician: Valerie Roys, DO  Primary Gastroenterologist:  Dr. Lucilla Lame  No chief complaint on file.   HPI: Jimmy Simmons is a 56 y.o. male here for follow-up after being treated for hepatitis C.  The patient's most recent labs 3 weeks ago showed his liver enzymes to be normal and his viral load to be undetectable. The patient has completed treatment and reports he has been doing well.  There was an issue during his treatment where he had not received his medication. He had not started the medication until the second batch came so there was no interruption in treatment. He finished treatment in May.  Current Outpatient Prescriptions  Medication Sig Dispense Refill  . albuterol (PROVENTIL HFA;VENTOLIN HFA) 108 (90 Base) MCG/ACT inhaler Inhale 2 puffs into the lungs every 6 (six) hours as needed for wheezing or shortness of breath. 1 Inhaler 0  . mometasone-formoterol (DULERA) 200-5 MCG/ACT AERO Inhale 2 puffs into the lungs 2 (two) times daily. 13 g 3  . nicotine (NICODERM CQ) 14 mg/24hr patch Place 1 patch (14 mg total) onto the skin daily. 28 patch 1   No current facility-administered medications for this visit.     Allergies as of 02/18/2017  . (No Known Allergies)    ROS:  General: Negative for anorexia, weight loss, fever, chills, fatigue, weakness. ENT: Negative for hoarseness, difficulty swallowing , nasal congestion. CV: Negative for chest pain, angina, palpitations, dyspnea on exertion, peripheral edema.  Respiratory: Negative for dyspnea at rest, dyspnea on exertion, cough, sputum, wheezing.  GI: See history of present illness. GU:  Negative for dysuria, hematuria, urinary incontinence, urinary frequency, nocturnal urination.  Endo: Negative for unusual weight change.    Physical Examination:   There were no vitals taken for this visit.  General: Well-nourished, well-developed in no acute distress.  Eyes: No icterus. Conjunctivae pink. Mouth:  Oropharyngeal mucosa moist and pink , no lesions erythema or exudate. Lungs: Clear to auscultation bilaterally. Non-labored. Heart: Regular rate and rhythm, no murmurs rubs or gallops.  Abdomen: Bowel sounds are normal, nontender, nondistended, no hepatosplenomegaly or masses, no abdominal bruits or hernia , no rebound or guarding.   Extremities: No lower extremity edema. No clubbing or deformities. Neuro: Alert and oriented x 3.  Grossly intact. Skin: Warm and dry, no jaundice.   Psych: Alert and cooperative, normal mood and affect.  Labs:    Imaging Studies: No results found.  Assessment and Plan:   [name] is a 56 y.o. y/o male who has a history of hepatitis C with his most recent labs showing no viral load and normal LFTs.  That was 3 weeks ago.  The patient will have labs checked 6 months after finishing treatment and in 1 year after finishing treatment.  If there is no viral infection detected after 1 year the patient has a sustained viral response.  The patient had received HBV vaccination last year but did not build an immune response. [place] surveillance every 6 month. The patient has been explained the plan and agrees with it.    Lucilla Lame, MD. Marval Regal   Note: This dictation was prepared with Dragon dictation along with smaller phrase technology. Any transcriptional errors that result from this process are unintentional.

## 2017-02-18 ENCOUNTER — Ambulatory Visit (INDEPENDENT_AMBULATORY_CARE_PROVIDER_SITE_OTHER): Payer: Medicaid Other | Admitting: Gastroenterology

## 2017-02-18 ENCOUNTER — Encounter: Payer: Self-pay | Admitting: Gastroenterology

## 2017-02-18 VITALS — BP 136/84 | Temp 98.1°F | Wt 231.0 lb

## 2017-02-18 DIAGNOSIS — B1920 Unspecified viral hepatitis C without hepatic coma: Secondary | ICD-10-CM

## 2017-02-24 ENCOUNTER — Ambulatory Visit: Payer: Medicaid Other | Admitting: Family Medicine

## 2017-03-06 ENCOUNTER — Ambulatory Visit: Payer: Medicaid Other | Admitting: Family Medicine

## 2017-03-20 ENCOUNTER — Other Ambulatory Visit: Payer: Self-pay | Admitting: Family Medicine

## 2017-03-20 MED ORDER — MOMETASONE FURO-FORMOTEROL FUM 200-5 MCG/ACT IN AERO: 2.0000 | INHALATION_SPRAY | Freq: Two times a day (BID) | RESPIRATORY_TRACT | 3 refills | Status: DC

## 2017-03-20 NOTE — Telephone Encounter (Signed)
Looks like insurance requested change from advair to [place] and that this is what he's been on. dulera refills sent.

## 2017-03-20 NOTE — Telephone Encounter (Signed)
Patient would like a prescription refill for Advair.  Please advise.  Thank you

## 2017-03-26 ENCOUNTER — Encounter: Payer: Self-pay | Admitting: Family Medicine

## 2017-03-26 ENCOUNTER — Ambulatory Visit (INDEPENDENT_AMBULATORY_CARE_PROVIDER_SITE_OTHER): Payer: Medicaid Other | Admitting: Family Medicine

## 2017-03-26 VITALS — BP 134/84 | HR 89 | Ht 68.5 in | Wt 234.0 lb

## 2017-03-26 DIAGNOSIS — B182 Chronic viral hepatitis C: Secondary | ICD-10-CM

## 2017-03-26 DIAGNOSIS — S39011A Strain of muscle, fascia and tendon of abdomen, initial encounter: Secondary | ICD-10-CM

## 2017-03-26 DIAGNOSIS — Z720 Tobacco use: Secondary | ICD-10-CM | POA: Diagnosis not present

## 2017-03-26 DIAGNOSIS — J449 Chronic obstructive pulmonary disease, unspecified: Secondary | ICD-10-CM

## 2017-03-26 MED ORDER — FLUTICASONE-SALMETEROL 250-50 MCG/DOSE IN AEPB: 1.0000 | INHALATION_SPRAY | Freq: Two times a day (BID) | RESPIRATORY_TRACT | 3 refills | Status: DC

## 2017-03-26 NOTE — Progress Notes (Signed)
BP 134/84   Pulse 89   Ht 5' 8.5" (1.74 m)   Wt 234 lb (106.1 kg)   SpO2 97%   BMI 35.06 kg/m    Subjective:    Patient ID: Jimmy Simmons, male    DOB: 05-12-1961, 56 y.o.   MRN: [id number]  HPI: Jimmy Simmons is a 56 y.o. male  Chief Complaint  Patient presents with  . Follow-up   Did not do well on the advair- switched to the dulera, would like to go back to the adavir. He didn't feel like the dulera worked as well for him and he would like to go back on the advair. He feels like his breathing is doing   Concerned about his hep C- following with Dr. Allen Norris. Appears to have cleared. He is happy with how things are going. Only needs to be checked 1x a year now. No belly pain. Feeling pretty well.  Having some muscle spasms in his sides and around his belly. He notices that they are worse when he has been coughing. Better with stretching, worse with coughing. No changes in his bowel habits. No nausea, no vomiting.  He notes that he is concerned about gaining weight. He lost weight while being treated for the hep c, but he has gained it back. He is back to his weight prior to treatment. He has not been doing any exercise. He has been watching what he has been eating.   Wondering if he should be screened for lung cancer.   He is otherwise doing well with no other concerns or complaints at this time.  Relevant past medical, surgical, family and social history reviewed and updated as indicated. Interim medical history since our last visit reviewed. Allergies and medications reviewed and updated.  Review of Systems  Constitutional: Negative.   Respiratory: Positive for cough and chest tightness. Negative for apnea, choking, shortness of breath, wheezing and stridor.   Cardiovascular: Negative.   Musculoskeletal: Positive for myalgias. Negative for arthralgias, back pain, gait problem, joint swelling, neck pain and neck stiffness.  Psychiatric/Behavioral: Negative.     Per HPI unless  specifically indicated above     Objective:    BP 134/84   Pulse 89   Ht 5' 8.5" (1.74 m)   Wt 234 lb (106.1 kg)   SpO2 97%   BMI 35.06 kg/m   Wt Readings from Last 3 Encounters:  03/26/17 234 lb (106.1 kg)  02/18/17 231 lb (104.8 kg)  01/27/17 228 lb 1.6 oz (103.5 kg)    Physical Exam  Constitutional: He is oriented to person, place, and time. He appears well-developed and well-nourished. No distress.  HENT:  Head: Normocephalic and atraumatic.  Right Ear: Hearing normal.  Left Ear: Hearing normal.  Nose: Nose normal.  Eyes: Conjunctivae and lids are normal. Right eye exhibits no discharge. Left eye exhibits no discharge. No scleral icterus.  Cardiovascular: Normal rate, regular rhythm, normal heart sounds and intact distal pulses.  Exam reveals no gallop and no friction rub.   No murmur heard. Pulmonary/Chest: Effort normal and breath sounds normal. No respiratory distress. He has no wheezes. He has no rales. He exhibits no tenderness.  Abdominal: Soft. Bowel sounds are normal. He exhibits no distension and no mass. There is no tenderness. There is no rebound and no guarding.  Musculoskeletal: Normal range of motion.  Neurological: He is alert and oriented to person, place, and time.  Skin: Skin is warm, dry and intact. No rash noted. He  is not diaphoretic. No erythema. No pallor.  Psychiatric: He has a normal mood and affect. His speech is normal and behavior is normal. Judgment and thought content normal. Cognition and memory are normal.  Nursing note and vitals reviewed.   Results for orders placed or performed in visit on 01/27/17  Microscopic Examination  Result Value Ref Range   WBC, UA 0-5 0 - 5 /hpf   RBC, UA None seen 0 - 2 /hpf   Epithelial Cells (non renal) 0-10 0 - 10 /hpf   Bacteria, UA None seen None seen/Few  CBC with Differential/Platelet  Result Value Ref Range   WBC 8.4 3.4 - 10.8 x10E3/uL   RBC 4.44 4.14 - 5.80 x10E6/uL   Hemoglobin 15.2 13.0 - 17.7  g/dL   Hematocrit 43.7 37.5 - 51.0 %   MCV 98 (H) 79 - 97 fL   MCH 34.2 (H) 26.6 - 33.0 pg   MCHC 34.8 31.5 - 35.7 g/dL   RDW 13.6 12.3 - 15.4 %   Platelets 100 (LL) 150 - 379 x10E3/uL   Neutrophils 46 Not Estab. %   Lymphs 45 Not Estab. %   Monocytes 7 Not Estab. %   Eos 1 Not Estab. %   Basos 1 Not Estab. %   Neutrophils Absolute 4.0 1.4 - 7.0 x10E3/uL   Lymphocytes Absolute 3.7 (H) 0.7 - 3.1 x10E3/uL   Monocytes Absolute 0.6 0.1 - 0.9 x10E3/uL   EOS (ABSOLUTE) 0.1 0.0 - 0.4 x10E3/uL   Basophils Absolute 0.0 0.0 - 0.2 x10E3/uL   Immature Granulocytes 0 Not Estab. %   Immature Grans (Abs) 0.0 0.0 - 0.1 x10E3/uL  Comprehensive metabolic panel  Result Value Ref Range   Glucose 93 65 - 99 mg/dL   BUN 14 6 - 24 mg/dL   Creatinine, Ser 0.81 0.76 - 1.27 mg/dL   GFR calc non Af Amer 101 >59 mL/min/1.73   GFR calc Af Amer 116 >59 mL/min/1.73   BUN/Creatinine Ratio 17 9 - 20   Sodium 139 134 - 144 mmol/L   Potassium 4.2 3.5 - 5.2 mmol/L   Chloride 101 96 - 106 mmol/L   CO2 22 18 - 29 mmol/L   Calcium 9.7 8.7 - 10.2 mg/dL   Total Protein 7.6 6.0 - 8.5 g/dL   Albumin 4.7 3.5 - 5.5 g/dL   Globulin, Total 2.9 1.5 - 4.5 g/dL   Albumin/Globulin Ratio 1.6 1.2 - 2.2   Bilirubin Total 0.5 0.0 - 1.2 mg/dL   Alkaline Phosphatase 80 39 - 117 IU/L   AST 32 0 - 40 IU/L   ALT 41 0 - 44 IU/L  HCV RNA quant  Result Value Ref Range   Hepatitis C Quantitation HCV Not Detected IU/mL   Test Information Comment   Hepatitis B surface antibody  Result Value Ref Range   Hepatitis B Surf Ab Quant <3.1 (L) Immunity>9.9 mIU/mL  Lipid Panel w/o Chol/HDL Ratio  Result Value Ref Range   Cholesterol, Total 120 100 - 199 mg/dL   Triglycerides 148 0 - 149 mg/dL   HDL 32 (L) >39 mg/dL   VLDL Cholesterol Cal 30 5 - 40 mg/dL   LDL Calculated 58 0 - 99 mg/dL  PSA  Result Value Ref Range   Prostate Specific Ag, Serum 0.8 0.0 - 4.0 ng/mL  TSH  Result Value Ref Range   TSH 2.050 0.450 - 4.500 uIU/mL  UA/M  w/rflx Culture, Routine  Result Value Ref Range   Specific Gravity, UA 1.015  1.005 - 1.030   pH, UA 5.0 5.0 - 7.5   Color, UA Yellow Yellow   Appearance Ur Clear Clear   Leukocytes, UA Trace (A) Negative   Protein, UA Negative Negative/Trace   Glucose, UA Negative Negative   Ketones, UA Negative Negative   RBC, UA Negative Negative   Bilirubin, UA Negative Negative   Urobilinogen, Ur 0.2 0.2 - 1.0 mg/dL   Nitrite, UA Negative Negative   Microscopic Examination See below:       Assessment & Plan:   Problem List Items Addressed This Visit      Respiratory   COPD (chronic obstructive pulmonary disease) ([place]) - Primary    Will change him back to advair. Sample given today. We expect PA-- he failed dulera.       Relevant Medications   Fluticasone-Salmeterol (ADVAIR DISKUS) 250-50 MCG/DOSE AEPB   Other Relevant Orders   Spirometry with Graph (Completed)     Digestive   Hepatitis C    Has completed treatment. Continue to follow with GI. Call with any concerns.         Other   Tobacco abuse    Will continue to work on cutting down. Will screen for lung cancer next year.        Other Visit Diagnoses    Strain of abdominal muscle, initial encounter       Due to coughing. Will get COPD under better control and reevaluate.        Follow up plan: Return in about 4 weeks (around 04/23/2017) for Follow up breathing.

## 2017-03-27 NOTE — Assessment & Plan Note (Signed)
Will change him back to advair. Sample given today. We expect PA-- he failed dulera.

## 2017-03-27 NOTE — Assessment & Plan Note (Signed)
Will continue to work on cutting down. Will screen for lung cancer next year.

## 2017-03-27 NOTE — Assessment & Plan Note (Signed)
Has completed treatment. Continue to follow with GI. Call with any concerns.

## 2017-04-29 DIAGNOSIS — H25041 Posterior subcapsular polar age-related cataract, right eye: Secondary | ICD-10-CM | POA: Diagnosis not present

## 2017-06-29 ENCOUNTER — Encounter: Payer: Self-pay | Admitting: *Deleted

## 2017-06-30 ENCOUNTER — Ambulatory Visit: Payer: Medicaid Other | Admitting: Anesthesiology

## 2017-06-30 ENCOUNTER — Encounter
Admission: RE | Disposition: A | Discharge status: Home / Self Care | Payer: Self-pay | Source: Ambulatory Visit | Attending: Ophthalmology

## 2017-06-30 ENCOUNTER — Encounter: Payer: Self-pay | Admitting: *Deleted

## 2017-06-30 ENCOUNTER — Ambulatory Visit
Admission: RE | Admit: 2017-06-30 | Discharge: 2017-06-30 | Disposition: A | Discharge status: Home / Self Care | Payer: Medicaid Other | Source: Ambulatory Visit | Attending: Ophthalmology | Admitting: Ophthalmology

## 2017-06-30 DIAGNOSIS — Z6833 Body mass index (BMI) 33.0-33.9, adult: Secondary | ICD-10-CM | POA: Diagnosis not present

## 2017-06-30 DIAGNOSIS — H2511 Age-related nuclear cataract, right eye: Secondary | ICD-10-CM | POA: Diagnosis present

## 2017-06-30 DIAGNOSIS — E669 Obesity, unspecified: Secondary | ICD-10-CM | POA: Diagnosis not present

## 2017-06-30 DIAGNOSIS — J449 Chronic obstructive pulmonary disease, unspecified: Secondary | ICD-10-CM | POA: Diagnosis not present

## 2017-06-30 DIAGNOSIS — B1920 Unspecified viral hepatitis C without hepatic coma: Secondary | ICD-10-CM | POA: Diagnosis not present

## 2017-06-30 DIAGNOSIS — F17210 Nicotine dependence, cigarettes, uncomplicated: Secondary | ICD-10-CM | POA: Diagnosis not present

## 2017-06-30 DIAGNOSIS — F329 Major depressive disorder, single episode, unspecified: Secondary | ICD-10-CM | POA: Insufficient documentation

## 2017-06-30 DIAGNOSIS — F419 Anxiety disorder, unspecified: Secondary | ICD-10-CM | POA: Diagnosis not present

## 2017-06-30 DIAGNOSIS — Z79899 Other long term (current) drug therapy: Secondary | ICD-10-CM | POA: Diagnosis not present

## 2017-06-30 DIAGNOSIS — H25041 Posterior subcapsular polar age-related cataract, right eye: Secondary | ICD-10-CM | POA: Diagnosis not present

## 2017-06-30 DIAGNOSIS — I739 Peripheral vascular disease, unspecified: Secondary | ICD-10-CM | POA: Diagnosis not present

## 2017-06-30 HISTORY — DX: Chronic obstructive pulmonary disease, unspecified: J44.9

## 2017-06-30 HISTORY — PX: CATARACT EXTRACTION W/PHACO: SHX586

## 2017-06-30 LAB — URINE DRUG SCREEN, QUALITATIVE (ARMC ONLY)
AMPHETAMINES, UR SCREEN: NOT DETECTED
Barbiturates, Ur Screen: NOT DETECTED
Benzodiazepine, Ur Scrn: NOT DETECTED
Cannabinoid 50 Ng, Ur ~~LOC~~: POSITIVE — AB
Cocaine Metabolite,Ur ~~LOC~~: NOT DETECTED
MDMA (Ecstasy)Ur Screen: NOT DETECTED
Methadone Scn, Ur: NOT DETECTED
OPIATE, UR SCREEN: NOT DETECTED
PHENCYCLIDINE (PCP) UR S: NOT DETECTED
Tricyclic, Ur Screen: NOT DETECTED

## 2017-06-30 SURGERY — PHACOEMULSIFICATION, CATARACT, WITH IOL INSERTION
Anesthesia: Monitor Anesthesia Care | Site: Eye | Laterality: Right | Wound class: Clean

## 2017-06-30 MED ORDER — FENTANYL CITRATE (PF) 100 MCG/2ML IJ SOLN: 25.0000 ug | INTRAMUSCULAR | Status: DC | PRN

## 2017-06-30 MED ORDER — MOXIFLOXACIN HCL 0.5 % OP SOLN
OPHTHALMIC | Status: DC | PRN
  Administered 2017-06-30: .2 mL via OPHTHALMIC

## 2017-06-30 MED ORDER — POVIDONE-IODINE 5 % OP SOLN
OPHTHALMIC | Status: AC
  Filled 2017-06-30: qty 30

## 2017-06-30 MED ORDER — MIDAZOLAM HCL 2 MG/2ML IJ SOLN
INTRAMUSCULAR | Status: DC | PRN
  Administered 2017-06-30: 2 mg via INTRAVENOUS

## 2017-06-30 MED ORDER — NA CHONDROIT SULF-NA HYALURON 40-17 MG/ML IO SOLN
INTRAOCULAR | Status: AC
  Filled 2017-06-30: qty 1

## 2017-06-30 MED ORDER — EPINEPHRINE PF 1 MG/ML IJ SOLN
INTRAOCULAR | Status: DC | PRN
  Administered 2017-06-30: 1 mL via OPHTHALMIC

## 2017-06-30 MED ORDER — EPINEPHRINE PF 1 MG/ML IJ SOLN
INTRAMUSCULAR | Status: AC
  Filled 2017-06-30: qty 1

## 2017-06-30 MED ORDER — MIDAZOLAM HCL 2 MG/2ML IJ SOLN
INTRAMUSCULAR | Status: AC
  Filled 2017-06-30: qty 2

## 2017-06-30 MED ORDER — LIDOCAINE HCL (PF) 4 % IJ SOLN
INTRAMUSCULAR | Status: AC
  Filled 2017-06-30: qty 5

## 2017-06-30 MED ORDER — LIDOCAINE HCL (PF) 4 % IJ SOLN
INTRAOCULAR | Status: DC | PRN
  Administered 2017-06-30: 2 mL via OPHTHALMIC

## 2017-06-30 MED ORDER — SODIUM CHLORIDE 0.9 % IV SOLN
INTRAVENOUS | Status: DC
  Administered 2017-06-30 (×2): via INTRAVENOUS

## 2017-06-30 MED ORDER — ARMC OPHTHALMIC DILATING DROPS
OPHTHALMIC | Status: AC
  Filled 2017-06-30: qty 0.4

## 2017-06-30 MED ORDER — NA CHONDROIT SULF-NA HYALURON 40-17 MG/ML IO SOLN
INTRAOCULAR | Status: DC | PRN
  Administered 2017-06-30: 1 mL via INTRAOCULAR

## 2017-06-30 MED ORDER — MOXIFLOXACIN HCL 0.5 % OP SOLN
OPHTHALMIC | Status: AC
  Filled 2017-06-30: qty 3

## 2017-06-30 MED ORDER — ONDANSETRON HCL 4 MG/2ML IJ SOLN: 4.0000 mg | Freq: Once | INTRAMUSCULAR | Status: DC | PRN

## 2017-06-30 MED ORDER — POVIDONE-IODINE 5 % OP SOLN
OPHTHALMIC | Status: DC | PRN
  Administered 2017-06-30: 1 via OPHTHALMIC

## 2017-06-30 MED ORDER — CARBACHOL 0.01 % IO SOLN
INTRAOCULAR | Status: DC | PRN
  Administered 2017-06-30: .5 mL via INTRAOCULAR

## 2017-06-30 MED ORDER — ARMC OPHTHALMIC DILATING DROPS
1.0000 "application " | OPHTHALMIC | Status: AC
  Administered 2017-06-30 (×3): 1 via OPHTHALMIC

## 2017-06-30 MED ORDER — MOXIFLOXACIN HCL 0.5 % OP SOLN: 1.0000 [drp] | OPHTHALMIC | Status: DC | PRN

## 2017-06-30 SURGICAL SUPPLY — 16 items
GLOVE BIO SURGEON STRL SZ8 (GLOVE) ×3 IMPLANT
GLOVE BIOGEL M 6.5 STRL (GLOVE) ×3 IMPLANT
GLOVE SURG LX 8.0 MICRO (GLOVE) ×2
GLOVE SURG LX STRL 8.0 MICRO (GLOVE) ×1 IMPLANT
GOWN STRL REUS W/ TWL LRG LVL3 (GOWN DISPOSABLE) ×2 IMPLANT
GOWN STRL REUS W/TWL LRG LVL3 (GOWN DISPOSABLE) ×4
LABEL CATARACT MEDS ST (LABEL) ×3 IMPLANT
LENS IOL TECNIS ITEC 20.0 (Intraocular Lens) ×3 IMPLANT
PACK CATARACT (MISCELLANEOUS) ×3 IMPLANT
PACK CATARACT BRASINGTON LX (MISCELLANEOUS) ×3 IMPLANT
PACK EYE AFTER SURG (MISCELLANEOUS) ×3 IMPLANT
SOL BSS BAG (MISCELLANEOUS) ×3
SOLUTION BSS BAG (MISCELLANEOUS) ×1 IMPLANT
SYR 5ML LL (SYRINGE) ×3 IMPLANT
WATER STERILE IRR 250ML POUR (IV SOLUTION) ×3 IMPLANT
WIPE NON LINTING 3.25X3.25 (MISCELLANEOUS) ×3 IMPLANT

## 2017-06-30 NOTE — Anesthesia Procedure Notes (Signed)
Date/Time: 06/30/2017 8:34 AM Performed by: Nelda Marseille Pre-anesthesia Checklist: Patient identified, Emergency Drugs available, Suction available, Patient being monitored and Timeout performed Oxygen Delivery Method: Nasal cannula

## 2017-06-30 NOTE — Op Note (Signed)
PREOPERATIVE DIAGNOSIS:  Nuclear sclerotic cataract of the right eye.   POSTOPERATIVE DIAGNOSIS:  nuclear sclerotic cataract right eye   OPERATIVE PROCEDURE: Procedure(s): CATARACT EXTRACTION PHACO AND INTRAOCULAR LENS PLACEMENT (IOC)-RIGHT   SURGEON:  Birder Robson, MD.   ANESTHESIA:  Anesthesiologist: [facility], Jane Canary, MD CRNA: Demetrius Charity, CRNA; Nelda Marseille, CRNA  1.      Managed anesthesia care. 2.      0.25ml of Shugarcaine was instilled in the eye following the paracentesis.   COMPLICATIONS:  None.   TECHNIQUE:   Stop and chop   DESCRIPTION OF PROCEDURE:  The patient was examined and consented in the preoperative holding area where the aforementioned topical anesthesia was applied to the right eye and then brought back to the Operating Room where the right eye was prepped and draped in the usual sterile ophthalmic fashion and a lid speculum was placed. A paracentesis was created with the side port blade and the anterior chamber was filled with viscoelastic. A near clear corneal incision was performed with the steel keratome. A continuous curvilinear capsulorrhexis was performed with a cystotome followed by the capsulorrhexis forceps. Hydrodissection and hydrodelineation were carried out with BSS on a blunt cannula. The lens was removed in a stop and chop  technique and the remaining cortical material was removed with the irrigation-aspiration handpiece. The capsular bag was inflated with viscoelastic and the Technis [id number]  lens was placed in the capsular bag without complication. The remaining viscoelastic was removed from the eye with the irrigation-aspiration handpiece. The wounds were hydrated. The anterior chamber was flushed with Miostat and the eye was inflated to physiologic pressure. 0.26ml of Vigamox was placed in the anterior chamber. The wounds were found to be water tight. The eye was dressed with Vigamox. The patient was given protective glasses to wear  throughout the day and a shield with which to sleep tonight. The patient was also given drops with which to begin a drop regimen today and will follow-up with me in one day.  Implant Name Type Inv. Item Serial No. Manufacturer Lot No. LRB No. Used  LENS IOL DIOP 20.0 - [id number] Intraocular Lens LENS IOL DIOP 20.0 [id number] AMO   Right 1   Procedure(s) with comments: CATARACT EXTRACTION PHACO AND INTRAOCULAR LENS PLACEMENT (IOC)-RIGHT (Right) - [place] 00:33 AP% 8.8 CDE 2.92 fluid pack lot # [id number]  Electronically signed: [place] 06/30/2017 8:48 AM

## 2017-06-30 NOTE — Transfer of Care (Signed)
Immediate Anesthesia Transfer of Care Note  Patient: Jimmy Simmons  Procedure(s) Performed: CATARACT EXTRACTION PHACO AND INTRAOCULAR LENS PLACEMENT (IOC)-RIGHT (Right Eye)  Patient Location: PACU  Anesthesia Type:MAC  Level of Consciousness: awake, alert  and oriented  Airway & Oxygen Therapy: Patient Spontanous Breathing  Post-op Assessment: Report given to RN and Post -op Vital signs reviewed and stable  Post vital signs: Reviewed and stable  Last Vitals:  Vitals:   06/30/17 0712  BP: (!) 145/86  Pulse: 70  Resp: 18  Temp: (!) 35.7 C  SpO2: 96%    Last Pain:  Vitals:   06/30/17 0712  TempSrc: Tympanic         Complications: No apparent anesthesia complications

## 2017-06-30 NOTE — Discharge Instructions (Signed)
Eye Surgery Discharge Instructions  Expect mild scratchy sensation or mild soreness. DO NOT RUB YOUR EYE!  The day of surgery:  Minimal physical activity, but bed rest is not required  No reading, computer work, or close hand work  No bending, lifting, or straining.  May watch TV  For 24 hours:  No driving, legal decisions, or alcoholic beverages  Safety precautions  Eat anything you prefer: It is better to start with liquids, then soup then solid foods.  _____ Eye patch should be worn until postoperative exam tomorrow.  ____ Solar shield eyeglasses should be worn for comfort in the sunlight/patch while sleeping  Resume all regular medications including aspirin or Coumadin if these were discontinued prior to surgery. You may shower, bathe, shave, or wash your hair. Tylenol may be taken for mild discomfort.  Call your doctor if you experience significant pain, nausea, or vomiting, fever > 101 or other signs of infection. [phone] or [phone] Specific instructions:  Follow-up Information    Birder Robson, MD Follow up on 07/01/2017.   Specialty:  Ophthalmology Why:  follow up appointment at 9:10 am in the office. Contact information: [place] [address] [phone]

## 2017-06-30 NOTE — Anesthesia Preprocedure Evaluation (Signed)
Anesthesia Evaluation  Patient identified by MRN, date of birth, ID band Patient awake    Reviewed: Allergy & Precautions  Airway Mallampati: I       Dental  (+) Teeth Intact   Pulmonary COPD, Current Smoker,     + decreased breath sounds      Cardiovascular + Peripheral Vascular Disease   Rhythm:Regular     Neuro/Psych Anxiety Depression    GI/Hepatic negative GI ROS, (+) Hepatitis -, Unspecified  Endo/Other  negative endocrine ROS  Renal/GU negative Renal ROS     Musculoskeletal   Abdominal (+) + obese,   Peds negative pediatric ROS (+)  Hematology negative hematology ROS (+)   Anesthesia Other Findings   Reproductive/Obstetrics                             Anesthesia Physical Anesthesia Plan  ASA: II  Anesthesia Plan: MAC   Post-op Pain Management:    Induction: Intravenous  PONV Risk Score and Plan: 0  Airway Management Planned: Natural Airway and Nasal Cannula  Additional Equipment:   Intra-op Plan:   Post-operative Plan:   Informed Consent: I have reviewed the patients History and Physical, chart, labs and discussed the procedure including the risks, benefits and alternatives for the proposed anesthesia with the patient or authorized representative who has indicated his/her understanding and acceptance.     Plan Discussed with: CRNA  Anesthesia Plan Comments:         Anesthesia Quick Evaluation

## 2017-06-30 NOTE — Anesthesia Postprocedure Evaluation (Signed)
Anesthesia Post Note  Patient: Jimmy Simmons  Procedure(s) Performed: CATARACT EXTRACTION PHACO AND INTRAOCULAR LENS PLACEMENT (IOC)-RIGHT (Right Eye)  Patient location during evaluation: PACU Anesthesia Type: MAC Level of consciousness: awake and alert and oriented Pain management: pain level controlled Vital Signs Assessment: post-procedure vital signs reviewed and stable Respiratory status: respiratory function stable Cardiovascular status: stable Anesthetic complications: no     Last Vitals:  Vitals:   06/30/17 0712  BP: (!) 145/86  Pulse: 70  Resp: 18  Temp: (!) 35.7 C  SpO2: 96%    Last Pain:  Vitals:   06/30/17 0712  TempSrc: Tympanic                 Blima Singer

## 2017-06-30 NOTE — Anesthesia Post-op Follow-up Note (Signed)
Anesthesia QCDR form completed.        

## 2017-06-30 NOTE — H&P (Signed)
All labs reviewed. Abnormal studies sent to patients PCP when indicated.  Previous H&P reviewed, patient examined, there are NO CHANGES.  Jimmy Simmons LOUIS10/23/20188:48 AM

## 2017-08-26 ENCOUNTER — Ambulatory Visit (INDEPENDENT_AMBULATORY_CARE_PROVIDER_SITE_OTHER): Payer: Self-pay | Admitting: Gastroenterology

## 2017-08-26 ENCOUNTER — Encounter: Payer: Self-pay | Admitting: Gastroenterology

## 2017-08-26 ENCOUNTER — Encounter (INDEPENDENT_AMBULATORY_CARE_PROVIDER_SITE_OTHER): Payer: Self-pay

## 2017-08-26 DIAGNOSIS — B182 Chronic viral hepatitis C: Secondary | ICD-10-CM

## 2017-08-26 NOTE — Progress Notes (Signed)
Patient did not show for his appointment.

## 2017-09-30 ENCOUNTER — Ambulatory Visit: Payer: Self-pay | Admitting: Gastroenterology

## 2017-10-01 ENCOUNTER — Ambulatory Visit (INDEPENDENT_AMBULATORY_CARE_PROVIDER_SITE_OTHER): Payer: Self-pay | Admitting: Family Medicine

## 2017-10-01 ENCOUNTER — Encounter: Payer: Self-pay | Admitting: Family Medicine

## 2017-10-01 VITALS — BP 146/91 | HR 73 | Temp 98.2°F | Resp 95 | Wt 248.1 lb

## 2017-10-01 DIAGNOSIS — Z720 Tobacco use: Secondary | ICD-10-CM

## 2017-10-01 DIAGNOSIS — F1110 Opioid abuse, uncomplicated: Secondary | ICD-10-CM

## 2017-10-01 DIAGNOSIS — B182 Chronic viral hepatitis C: Secondary | ICD-10-CM

## 2017-10-01 DIAGNOSIS — J449 Chronic obstructive pulmonary disease, unspecified: Secondary | ICD-10-CM

## 2017-10-01 DIAGNOSIS — F3341 Major depressive disorder, recurrent, in partial remission: Secondary | ICD-10-CM

## 2017-10-01 DIAGNOSIS — F1010 Alcohol abuse, uncomplicated: Secondary | ICD-10-CM

## 2017-10-01 DIAGNOSIS — Z23 Encounter for immunization: Secondary | ICD-10-CM

## 2017-10-01 DIAGNOSIS — F419 Anxiety disorder, unspecified: Secondary | ICD-10-CM

## 2017-10-01 MED ORDER — NICOTINE 14 MG/24HR TD PT24: 14.0000 mg | MEDICATED_PATCH | Freq: Every day | TRANSDERMAL | 1 refills | Status: DC

## 2017-10-01 MED ORDER — BUPROPION HCL ER (SR) 150 MG PO TB12: ORAL_TABLET | ORAL | 3 refills | Status: DC

## 2017-10-01 MED ORDER — FLUTICASONE-SALMETEROL 250-50 MCG/DOSE IN AEPB: 1.0000 | INHALATION_SPRAY | Freq: Two times a day (BID) | RESPIRATORY_TRACT | 3 refills | Status: DC

## 2017-10-01 MED ORDER — ALBUTEROL SULFATE HFA 108 (90 BASE) MCG/ACT IN AERS
2.0000 | INHALATION_SPRAY | Freq: Four times a day (QID) | RESPIRATORY_TRACT | 0 refills | Status: DC | PRN
Start: 2017-10-01 — End: 2018-02-11

## 2017-10-01 NOTE — Assessment & Plan Note (Signed)
Stable. Will check labs at physical in 1 month. Call with any concerns. Starting wellbutrin for smoking, hopefully will also help with depression and anxiety.

## 2017-10-01 NOTE — Assessment & Plan Note (Signed)
S/p treatment. Checking viral load today. Await results.

## 2017-10-01 NOTE — Assessment & Plan Note (Signed)
Not using advair appropriately. Advised daily use especially during the winter. Refills given. Call with any concerns. Continue to monitor.

## 2017-10-01 NOTE — Assessment & Plan Note (Signed)
Not currently using. Continue to abstain.

## 2017-10-01 NOTE — Progress Notes (Signed)
BP (!) 146/91 (BP Location: Left Arm, Patient Position: Sitting)   Pulse 73   Temp 98.2 F (36.8 C) (Oral)   Resp (!) 95   Wt 248 lb 1.6 oz (112.5 kg)   BMI 36.64 kg/m    Subjective:    Patient ID: Jimmy Simmons, male    DOB: Apr 07, 1961, 57 y.o.   MRN: [id number]  HPI: Jimmy Simmons is a 57 y.o. male  Chief Complaint  Patient presents with  . Follow-up    6 month follow up   . Anxiety   SMOKING CESSATION Smoking Status: current every day smoker Smoking Amount: 3/4 ppd Smoking Onset: 57yo Smoking Quit Date: not set Smoking triggers: stress Type of tobacco use: cigarettes Children in the house: yes Other household members who smoke: no Treatments attempted: cold [place] Pneumovax: up to date  Has been gaining weight. Has been trying to watch his drinking. Has been feeling pretty good. Has been having some decreased "vitality" and libido as well as ED issues- would like to have his testosterone checked. Anxiety is staying pretty stable.   GAD 7 : Generalized Anxiety Score 10/01/2017 11/08/2015  Nervous, Anxious, on Edge 2 2  Control/stop worrying 2 2  Worry too much - different things 3 2  Trouble relaxing 3 2  Restless 2 1  Easily annoyed or irritable 2 2  Afraid - awful might happen 1 0  Total GAD 7 Score 15 11  Anxiety Difficulty Somewhat difficult Somewhat difficult    Depression screen [facility] 2/9 10/01/2017 11/08/2015  Decreased Interest 0 2  Down, Depressed, Hopeless 0 2  PHQ - 2 Score 0 4  Altered sleeping - 2  Tired, decreased energy - 2  Change in appetite - 2  Feeling bad or failure about yourself  - 2  Trouble concentrating - 2  Moving slowly or fidgety/restless - 1  Suicidal thoughts - 0  PHQ-9 Score - 15  Difficult doing work/chores - Very difficult   COPD COPD status: stable Satisfied with current treatment?: yes Oxygen use: no Dyspnea frequency: rarely Cough frequency: occasionally Rescue inhaler frequency:  occasionally Limitation of activity:  no Productive cough: no Pneumovax: Up to Date Influenza: Up to Date   Relevant past medical, surgical, family and social history reviewed and updated as indicated. Interim medical history since our last visit reviewed. Allergies and medications reviewed and updated.  Review of Systems  Constitutional: Negative.   Respiratory: Negative.   Cardiovascular: Negative.   Psychiatric/Behavioral: Positive for dysphoric mood. Negative for agitation, behavioral problems, confusion, decreased concentration and hallucinations. The patient is nervous/anxious. The patient is not hyperactive.     Per HPI unless specifically indicated above     Objective:    BP (!) 146/91 (BP Location: Left Arm, Patient Position: Sitting)   Pulse 73   Temp 98.2 F (36.8 C) (Oral)   Resp (!) 95   Wt 248 lb 1.6 oz (112.5 kg)   BMI 36.64 kg/m   Wt Readings from Last 3 Encounters:  10/01/17 248 lb 1.6 oz (112.5 kg)  06/30/17 226 lb (102.5 kg)  03/26/17 234 lb (106.1 kg)    Physical Exam  Constitutional: He is oriented to person, place, and time. He appears well-developed and well-nourished. No distress.  HENT:  Head: Normocephalic and atraumatic.  Right Ear: Hearing normal.  Left Ear: Hearing normal.  Nose: Nose normal.  Eyes: Conjunctivae and lids are normal. Right eye exhibits no discharge. Left eye exhibits no discharge. No scleral icterus.  Cardiovascular: Normal rate, regular rhythm, normal heart sounds and intact distal pulses. Exam reveals no gallop and no friction rub.  No murmur heard. Pulmonary/Chest: Effort normal and breath sounds normal. No respiratory distress. He has no wheezes. He has no rales. He exhibits no tenderness.  Musculoskeletal: Normal range of motion.  Neurological: He is alert and oriented to person, place, and time.  Skin: Skin is warm, dry and intact. No rash noted. He is not diaphoretic. No erythema. No pallor.  Psychiatric: He has a normal mood and affect. His speech is  normal and behavior is normal. Judgment and thought content normal. Cognition and memory are normal.    Results for orders placed or performed during the hospital encounter of 06/30/17  Urine Drug Screen, Qualitative ([facility] only)  Result Value Ref Range   Tricyclic, Ur Screen NONE DETECTED NONE DETECTED   Amphetamines, Ur Screen NONE DETECTED NONE DETECTED   MDMA (Ecstasy)Ur Screen NONE DETECTED NONE DETECTED   Cocaine Metabolite,Ur [place] NONE DETECTED NONE DETECTED   Opiate, Ur Screen NONE DETECTED NONE DETECTED   Phencyclidine (PCP) Ur S NONE DETECTED NONE DETECTED   Cannabinoid 50 Ng, Ur [place] POSITIVE (A) NONE DETECTED   Barbiturates, Ur Screen NONE DETECTED NONE DETECTED   Benzodiazepine, Ur Scrn NONE DETECTED NONE DETECTED   Methadone Scn, Ur NONE DETECTED NONE DETECTED      Assessment & Plan:   Problem List Items Addressed This Visit      Respiratory   COPD (chronic obstructive pulmonary disease) ([place]) - Primary    Not using advair appropriately. Advised daily use especially during the winter. Refills given. Call with any concerns. Continue to monitor.       Relevant Medications   Fluticasone-Salmeterol (ADVAIR DISKUS) 250-50 MCG/DOSE AEPB   albuterol (PROVENTIL HFA;VENTOLIN HFA) 108 (90 Base) MCG/ACT inhaler   nicotine (NICODERM CQ) 14 mg/24hr patch   Other Relevant Orders   Comprehensive metabolic panel   CBC with Differential/Platelet     Digestive   Hepatitis C    S/p treatment. Checking viral load today. Await results.       Relevant Orders   Comprehensive metabolic panel   HCV RNA quant     Other   Anxiety    Stable. Will check labs at physical in 1 month. Call with any concerns. Starting wellbutrin for smoking, hopefully will also help with depression and anxiety.       Relevant Medications   buPROPion (WELLBUTRIN SR) 150 MG 12 hr tablet   Other Relevant Orders   Comprehensive metabolic panel   TSH   Alcohol abuse    Continues to work on cutting down.  Still drinking.       Opiate abuse, episodic (HCC)    Not currently using. Continue to abstain.       Depression    Stable. Will check labs at physical in 1 month. Call with any concerns. Starting wellbutrin for smoking, hopefully will also help with depression and anxiety.       Relevant Medications   buPROPion (WELLBUTRIN SR) 150 MG 12 hr tablet   Tobacco abuse    Would like to quit- will get him started on wellbutrin. OK to use nicoderm if needed. Recheck 1 month. Call with any concerns.        Other Visit Diagnoses    Immunization due       Flu shot given today.   Relevant Orders   Flu Vaccine QUAD 6+ mos PF IM (Fluarix Quad PF) (  Completed)       Follow up plan: Return in about 4 weeks (around 10/29/2017) for Physical and follow up smoking.

## 2017-10-01 NOTE — Patient Instructions (Addendum)

## 2017-10-01 NOTE — Assessment & Plan Note (Signed)
Would like to quit- will get him started on wellbutrin. OK to use nicoderm if needed. Recheck 1 month. Call with any concerns.

## 2017-10-01 NOTE — Assessment & Plan Note (Signed)
Continues to work on cutting down. Still drinking.

## 2017-10-02 ENCOUNTER — Encounter: Payer: Self-pay | Admitting: Family Medicine

## 2017-10-02 LAB — CBC WITH DIFFERENTIAL/PLATELET
BASOS: 0 %
Basophils Absolute: 0 10*3/uL (ref 0.0–0.2)
EOS (ABSOLUTE): 0.1 10*3/uL (ref 0.0–0.4)
Eos: 2 %
Hematocrit: 42.6 % (ref 37.5–51.0)
Hemoglobin: 15.2 g/dL (ref 13.0–17.7)
IMMATURE GRANULOCYTES: 0 %
Immature Grans (Abs): 0 10*3/uL (ref 0.0–0.1)
Lymphocytes Absolute: 3.4 10*3/uL — ABNORMAL HIGH (ref 0.7–3.1)
Lymphs: 49 %
MCH: 34.5 pg — ABNORMAL HIGH (ref 26.6–33.0)
MCHC: 35.7 g/dL (ref 31.5–35.7)
MCV: 97 fL (ref 79–97)
MONOS ABS: 0.5 10*3/uL (ref 0.1–0.9)
Monocytes: 7 %
NEUTROS PCT: 42 %
Neutrophils Absolute: 2.9 10*3/uL (ref 1.4–7.0)
PLATELETS: 126 10*3/uL — AB (ref 150–379)
RBC: 4.41 x10E6/uL (ref 4.14–5.80)
RDW: 13.1 % (ref 12.3–15.4)
WBC: 7 10*3/uL (ref 3.4–10.8)

## 2017-10-02 LAB — COMPREHENSIVE METABOLIC PANEL
A/G RATIO: 1.8 (ref 1.2–2.2)
ALK PHOS: 83 IU/L (ref 39–117)
ALT: 41 IU/L (ref 0–44)
AST: 30 IU/L (ref 0–40)
Albumin: 4.7 g/dL (ref 3.5–5.5)
BILIRUBIN TOTAL: 0.3 mg/dL (ref 0.0–1.2)
BUN/Creatinine Ratio: 15 (ref 9–20)
BUN: 12 mg/dL (ref 6–24)
CHLORIDE: 104 mmol/L (ref 96–106)
CO2: 21 mmol/L (ref 20–29)
Calcium: 9.6 mg/dL (ref 8.7–10.2)
Creatinine, Ser: 0.79 mg/dL (ref 0.76–1.27)
GFR calc Af Amer: 117 mL/min/{1.73_m2} (ref >59)
GFR calc non Af Amer: 101 mL/min/{1.73_m2} (ref >59)
Globulin, Total: 2.6 g/dL (ref 1.5–4.5)
Glucose: 102 mg/dL — ABNORMAL HIGH (ref 65–99)
POTASSIUM: 4 mmol/L (ref 3.5–5.2)
SODIUM: 142 mmol/L (ref 134–144)
Total Protein: 7.3 g/dL (ref 6.0–8.5)

## 2017-10-02 LAB — TSH: TSH: 3.54 u[IU]/mL (ref 0.450–4.500)

## 2017-10-07 ENCOUNTER — Telehealth: Payer: Self-pay

## 2017-10-07 NOTE — Telephone Encounter (Signed)
Pt notified of lab results

## 2017-10-07 NOTE — Telephone Encounter (Signed)
-----   Message from Lucilla Lame, MD sent at 10/07/2017  9:16 AM EST ----- Let the patient know that his hepatitis C is undetectable.

## 2017-10-24 LAB — HCV RNA QUANT: Hepatitis C Quantitation: NOT DETECTED IU/mL

## 2017-10-24 LAB — SPECIMEN STATUS REPORT

## 2017-11-09 ENCOUNTER — Ambulatory Visit: Payer: Self-pay | Admitting: Gastroenterology

## 2017-11-10 ENCOUNTER — Encounter: Payer: Self-pay | Admitting: Family Medicine

## 2017-11-10 ENCOUNTER — Ambulatory Visit (INDEPENDENT_AMBULATORY_CARE_PROVIDER_SITE_OTHER): Payer: Self-pay | Admitting: Family Medicine

## 2017-11-10 VITALS — BP 159/96 | HR 73 | Temp 98.6°F | Ht 67.4 in | Wt 244.1 lb

## 2017-11-10 DIAGNOSIS — J069 Acute upper respiratory infection, unspecified: Secondary | ICD-10-CM

## 2017-11-10 DIAGNOSIS — Z0000 Encounter for general adult medical examination without abnormal findings: Secondary | ICD-10-CM

## 2017-11-10 DIAGNOSIS — K429 Umbilical hernia without obstruction or gangrene: Secondary | ICD-10-CM

## 2017-11-10 DIAGNOSIS — Z0001 Encounter for general adult medical examination with abnormal findings: Secondary | ICD-10-CM

## 2017-11-10 DIAGNOSIS — Z125 Encounter for screening for malignant neoplasm of prostate: Secondary | ICD-10-CM

## 2017-11-10 DIAGNOSIS — R030 Elevated blood-pressure reading, without diagnosis of hypertension: Secondary | ICD-10-CM
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
## 2017-11-10 NOTE — Assessment & Plan Note (Signed)
Not in pain. Continue to monitor. Call with any concerns and we will get him into general surgery.

## 2017-11-11 ENCOUNTER — Encounter: Payer: Self-pay | Admitting: Family Medicine

## 2017-11-11 LAB — LIPID PANEL W/O CHOL/HDL RATIO
Cholesterol, Total: 170 mg/dL (ref 100–199)
HDL: 57 mg/dL
LDL Calculated: 92 mg/dL (ref 0–99)
Triglycerides: 104 mg/dL (ref 0–149)
VLDL Cholesterol Cal: 21 mg/dL (ref 5–40)

## 2017-11-11 LAB — PSA: PROSTATE SPECIFIC AG, SERUM: 0.8 ng/mL (ref 0.0–4.0)

## 2017-11-23 ENCOUNTER — Telehealth: Payer: Self-pay

## 2017-11-23 ENCOUNTER — Encounter: Payer: Self-pay | Admitting: Family Medicine

## 2017-11-23 ENCOUNTER — Ambulatory Visit (INDEPENDENT_AMBULATORY_CARE_PROVIDER_SITE_OTHER): Payer: Self-pay | Admitting: Family Medicine

## 2017-11-23 VITALS — BP 137/83 | HR 91 | Temp 97.8°F | Wt 252.3 lb

## 2017-11-23 DIAGNOSIS — J0100 Acute maxillary sinusitis, unspecified: Secondary | ICD-10-CM

## 2017-11-23 MED ORDER — AMOXICILLIN-POT CLAVULANATE 875-125 MG PO TABS: 1.0000 | ORAL_TABLET | Freq: Two times a day (BID) | ORAL | 0 refills | Status: DC

## 2017-11-23 NOTE — Telephone Encounter (Signed)
Pt called back in to follow up . Scheduled pt to be seen today with Leodis Sias at 2:45p.

## 2017-11-23 NOTE — Telephone Encounter (Signed)
Needs an appointment.

## 2017-11-23 NOTE — Telephone Encounter (Signed)
Copied from [place] [id number]. Topic: Quick Communication - See Telephone Encounter >> Nov 23, 2017 10:04 AM Antonieta Iba C wrote: CRM for notification. See Telephone encounter for:  pt called in to ask if provider could send in a Rx to the pharmacy for a possible sinus infection. Pt says that he is pretty sure that it is a sinus infection. Pt says that he is having mucus and congestion. ALSO, pt is requesting a referral to [facility]   Pharmacy: [facility]   11/23/17.   Routing to provider. Patient seen for URI 11/10/17.

## 2017-11-23 NOTE — Progress Notes (Signed)
BP 137/83 (BP Location: Right Arm, Patient Position: Sitting, Cuff Size: Normal)   Pulse 91   Temp 97.8 F (36.6 C) (Oral)   Wt 252 lb 4.8 oz (114.4 kg)   SpO2 95%   BMI 39.05 kg/m    Subjective:    Patient ID: Jimmy Simmons, male    DOB: August 16, 1961, 57 y.o.   MRN: [id number]  HPI: Jimmy Simmons is a 57 y.o. male  Chief Complaint  Patient presents with  . Sinusitis    Ongoing and worsening for almost 2 weeks.  . Facial Pain  . Nasal Congestion  . Headache   2 week history of sore throat, congestion, malaise. Now worsening facial pain and pressure, thick drainage, headaches, eye pressure, and generalized aches, low grade fevers. Has taken a few prednisone tablets he had at home as well as some mucinex, ibuprofen, BCs with minimal relief. Hx of COPD, feels breathing is doing okay for now. Denies CP, SOB, N/V/D. Several sick contacts lately. Still every day smoker.   Relevant past medical, surgical, family and social history reviewed and updated as indicated. Interim medical history since our last visit reviewed. Allergies and medications reviewed and updated.  Review of Systems  Per HPI unless specifically indicated above     Objective:    BP 137/83 (BP Location: Right Arm, Patient Position: Sitting, Cuff Size: Normal)   Pulse 91   Temp 97.8 F (36.6 C) (Oral)   Wt 252 lb 4.8 oz (114.4 kg)   SpO2 95%   BMI 39.05 kg/m   Wt Readings from Last 3 Encounters:  11/23/17 252 lb 4.8 oz (114.4 kg)  11/10/17 244 lb 2 oz (110.7 kg)  10/01/17 248 lb 1.6 oz (112.5 kg)    Physical Exam  Constitutional: He is oriented to person, place, and time. He appears well-developed and well-nourished. No distress.  HENT:  Head: Atraumatic.  Right Ear: External ear normal.  Left Ear: External ear normal.  Oropharynx and nasal mucosa erythematous with thick drainage present B/l maxillary sinuses ttp  Eyes: Conjunctivae are normal. Pupils are equal, round, and reactive to light.  Neck: Normal  range of motion. Neck supple.  Cardiovascular: Normal rate and normal heart sounds.  Pulmonary/Chest: Effort normal. No respiratory distress. He has wheezes (minimal).  Musculoskeletal: Normal range of motion.  Lymphadenopathy:    He has no cervical adenopathy.  Neurological: He is alert and oriented to person, place, and time.  Skin: Skin is warm and dry.  Psychiatric: He has a normal mood and affect. His behavior is normal.  Nursing note and vitals reviewed.  Results for orders placed or performed in visit on 11/10/17  Lipid Panel w/o Chol/HDL Ratio  Result Value Ref Range   Cholesterol, Total 170 100 - 199 mg/dL   Triglycerides 104 0 - 149 mg/dL   HDL 57 >39 mg/dL   VLDL Cholesterol Cal 21 5 - 40 mg/dL   LDL Calculated 92 0 - 99 mg/dL  PSA  Result Value Ref Range   Prostate Specific Ag, Serum 0.8 0.0 - 4.0 ng/mL      Assessment & Plan:   Problem List Items Addressed This Visit    None    Visit Diagnoses    Acute maxillary sinusitis, recurrence not specified    -  Primary   Tx with augmentin, mucinex, sinus rinses, humidifier. Return precautions reviewed, f/u if no improvement   Relevant Medications   amoxicillin-clavulanate (AUGMENTIN) 875-125 MG tablet  Follow up plan: Return for as scheduled.

## 2017-11-23 NOTE — Telephone Encounter (Signed)
Please call and schedule patient an appointment per Dr. [name].

## 2017-11-25 NOTE — Patient Instructions (Signed)
Follow up as needed

## 2018-01-11 ENCOUNTER — Ambulatory Visit: Payer: Medicaid Other | Admitting: Family Medicine

## 2018-01-11 ENCOUNTER — Encounter: Payer: Self-pay | Admitting: Family Medicine

## 2018-01-11 VITALS — BP 159/90 | HR 65 | Wt 249.3 lb

## 2018-01-11 DIAGNOSIS — Z720 Tobacco use: Secondary | ICD-10-CM

## 2018-01-11 DIAGNOSIS — I10 Essential (primary) hypertension: Secondary | ICD-10-CM

## 2018-01-11 DIAGNOSIS — Z85828 Personal history of other malignant neoplasm of skin: Secondary | ICD-10-CM | POA: Diagnosis not present

## 2018-01-11 LAB — MICROALBUMIN, URINE WAIVED
CREATININE, URINE WAIVED: 100 mg/dL (ref 10–300)
MICROALB, UR WAIVED: 10 mg/L (ref 0–19)
Microalb/Creat Ratio: <30 mg/g (ref <30)

## 2018-01-11 MED ORDER — LISINOPRIL 10 MG PO TABS: 10.0000 mg | ORAL_TABLET | Freq: Every day | ORAL | 3 refills | Status: DC

## 2018-01-11 NOTE — Assessment & Plan Note (Signed)
Not interested in medication to quit right now. Would like lung cancer screening. Message to [employer] sent today.

## 2018-01-11 NOTE — Progress Notes (Addendum)
BP (!) 159/90 (BP Location: Left Arm, Patient Position: Sitting, Cuff Size: Large)   Pulse 65   Wt 249 lb 5 oz (113.1 kg)   SpO2 95%   BMI 38.59 kg/m    Subjective:    Patient ID: Jimmy Simmons, male    DOB: May 16, 1961, 57 y.o.   MRN: [id number]  HPI: Jimmy Simmons is a 57 y.o. male  Chief Complaint  Patient presents with  . Hypertension   HYPERTENSION- has not been doing his DASH diet. Has been keeping active Hypertension status: uncontrolled  Satisfied with current treatment? no Duration of hypertension: chronic BP monitoring frequency:  not checking BP medication side effects:  Not on anything Medication compliance:  Not on anything Previous BP meds: none Aspirin: no Recurrent headaches: no Visual changes: no Palpitations: no Dyspnea: no Chest pain: no Lower extremity edema: no Dizzy/lightheaded: no  Relevant past medical, surgical, family and social history reviewed and updated as indicated. Interim medical history since our last visit reviewed. Allergies and medications reviewed and updated.  Review of Systems  Constitutional: Negative.   Respiratory: Negative.   Cardiovascular: Negative.   Psychiatric/Behavioral: Negative.     Per HPI unless specifically indicated above     Objective:    BP (!) 159/90 (BP Location: Left Arm, Patient Position: Sitting, Cuff Size: Large)   Pulse 65   Wt 249 lb 5 oz (113.1 kg)   SpO2 95%   BMI 38.59 kg/m   Wt Readings from Last 3 Encounters:  01/11/18 249 lb 5 oz (113.1 kg)  11/23/17 252 lb 4.8 oz (114.4 kg)  11/10/17 244 lb 2 oz (110.7 kg)    Physical Exam  Constitutional: He is oriented to person, place, and time. He appears well-developed and well-nourished. No distress.  HENT:  Head: Normocephalic and atraumatic.  Right Ear: Hearing normal.  Left Ear: Hearing normal.  Nose: Nose normal.  Eyes: Conjunctivae and lids are normal. Right eye exhibits no discharge. Left eye exhibits no discharge. No scleral icterus.    Cardiovascular: Normal rate, regular rhythm, normal heart sounds and intact distal pulses. Exam reveals no gallop and no friction rub.  No murmur heard. Pulmonary/Chest: Effort normal and breath sounds normal. No stridor. No respiratory distress. He has no wheezes. He has no rales. He exhibits no tenderness.  Musculoskeletal: Normal range of motion.  Neurological: He is alert and oriented to person, place, and time.  Skin: Skin is warm, dry and intact. Capillary refill takes less than 2 seconds. No rash noted. He is not diaphoretic. No erythema. No pallor.  Psychiatric: He has a normal mood and affect. His speech is normal and behavior is normal. Judgment and thought content normal. Cognition and memory are normal.  Nursing note and vitals reviewed.   Results for orders placed or performed in visit on 11/10/17  Lipid Panel w/o Chol/HDL Ratio  Result Value Ref Range   Cholesterol, Total 170 100 - 199 mg/dL   Triglycerides 104 0 - 149 mg/dL   HDL 57 >39 mg/dL   VLDL Cholesterol Cal 21 5 - 40 mg/dL   LDL Calculated 92 0 - 99 mg/dL  PSA  Result Value Ref Range   Prostate Specific Ag, Serum 0.8 0.0 - 4.0 ng/mL      Assessment & Plan:   Problem List Items Addressed This Visit      Cardiovascular and Mediastinum   HTN (hypertension) - Primary    Will start on 10mg  lisinopril to try to protect kidneys and  keep BP under better control. Checking labs today. Recheck 1 month. Call with any concerns.       Relevant Medications   lisinopril (PRINIVIL,ZESTRIL) 10 MG tablet   Other Relevant Orders   Basic metabolic panel   Microalbumin, Urine Waived     Other   Tobacco abuse    Not interested in medication to quit right now. Would like lung cancer screening. Message to [employer] sent today.       Other Visit Diagnoses    History of skin cancer       Needs to go back to dermatology. Referral generated today.   Relevant Orders   Ambulatory referral to Dermatology       Follow  up plan: Return in about 1 month (around 02/08/2018) for follow up BP.

## 2018-01-11 NOTE — Assessment & Plan Note (Signed)
Will start on 10mg  lisinopril to try to protect kidneys and keep BP under better control. Checking labs today. Recheck 1 month. Call with any concerns.

## 2018-01-11 NOTE — Patient Instructions (Signed)
Managing Your Hypertension Hypertension is commonly called high blood pressure. This is when the force of your blood pressing against the walls of your arteries is too strong. Arteries are blood vessels that carry blood from your heart throughout your body. Hypertension forces the heart to work harder to pump blood, and may cause the arteries to become narrow or stiff. Having untreated or uncontrolled hypertension can cause heart attack, stroke, kidney disease, and other problems. What are blood pressure readings? A blood pressure reading consists of a higher number over a lower number. Ideally, your blood pressure should be below 120/80. The first ("top") number is called the systolic pressure. It is a measure of the pressure in your arteries as your heart beats. The second ("bottom") number is called the diastolic pressure. It is a measure of the pressure in your arteries as the heart relaxes. What does my blood pressure reading mean? Blood pressure is classified into four stages. Based on your blood pressure reading, your health care provider may use the following stages to determine what type of treatment you need, if any. Systolic pressure and diastolic pressure are measured in a unit called mm Hg. Normal  Systolic pressure: below 120.  Diastolic pressure: below 80. Elevated  Systolic pressure: 120-129.  Diastolic pressure: below 80. Hypertension stage 1  Systolic pressure: 130-139.  Diastolic pressure: 80-89. Hypertension stage 2  Systolic pressure: 140 or above.  Diastolic pressure: 90 or above. What health risks are associated with hypertension? Managing your hypertension is an important responsibility. Uncontrolled hypertension can lead to:  A heart attack.  A stroke.  A weakened blood vessel (aneurysm).  Heart failure.  Kidney damage.  Eye damage.  Metabolic syndrome.  Memory and concentration problems.  What changes can I make to manage my  hypertension? Hypertension can be managed by making lifestyle changes and possibly by taking medicines. Your health care provider will help you make a plan to bring your blood pressure within a normal range. Eating and drinking  Eat a diet that is high in fiber and potassium, and low in salt (sodium), added sugar, and fat. An example eating plan is called the DASH (Dietary Approaches to Stop Hypertension) diet. To eat this way: ? Eat plenty of fresh fruits and vegetables. Try to fill half of your plate at each meal with fruits and vegetables. ? Eat whole grains, such as whole wheat pasta, brown rice, or whole grain bread. Fill about one quarter of your plate with whole grains. ? Eat low-fat diary products. ? Avoid fatty cuts of meat, processed or cured meats, and poultry with skin. Fill about one quarter of your plate with lean proteins such as fish, chicken without skin, beans, eggs, and tofu. ? Avoid premade and processed foods. These tend to be higher in sodium, added sugar, and fat.  Reduce your daily sodium intake. Most people with hypertension should eat less than 1,500 mg of sodium a day.  Limit alcohol intake to no more than 1 drink a day for nonpregnant women and 2 drinks a day for men. One drink equals 12 oz of beer, 5 oz of wine, or 1 oz of hard liquor. Lifestyle  Work with your health care provider to maintain a healthy body weight, or to lose weight. Ask what an ideal weight is for you.  Get at least 30 minutes of exercise that causes your heart to beat faster (aerobic exercise) most days of the week. Activities may include walking, swimming, or biking.  Include exercise   to strengthen your muscles (resistance exercise), such as weight lifting, as part of your weekly exercise routine. Try to do these types of exercises for 30 minutes at least 3 days a week.  Do not use any products that contain nicotine or tobacco, such as cigarettes and e-cigarettes. If you need help quitting, ask  your health care provider.  Control any long-term (chronic) conditions you have, such as high cholesterol or diabetes. Monitoring  Monitor your blood pressure at home as told by your health care provider. Your personal target blood pressure may vary depending on your medical conditions, your age, and other factors.  Have your blood pressure checked regularly, as often as told by your health care provider. Working with your health care provider  Review all the medicines you take with your health care provider because there may be side effects or interactions.  Talk with your health care provider about your diet, exercise habits, and other lifestyle factors that may be contributing to hypertension.  Visit your health care provider regularly. Your health care provider can help you create and adjust your plan for managing hypertension. Will I need medicine to control my blood pressure? Your health care provider may prescribe medicine if lifestyle changes are not enough to get your blood pressure under control, and if:  Your systolic blood pressure is 130 or higher.  Your diastolic blood pressure is 80 or higher.  Take medicines only as told by your health care provider. Follow the directions carefully. Blood pressure medicines must be taken as prescribed. The medicine does not work as well when you skip doses. Skipping doses also puts you at risk for problems. Contact a health care provider if:  You think you are having a reaction to medicines you have taken.  You have repeated (recurrent) headaches.  You feel dizzy.  You have swelling in your ankles.  You have trouble with your vision. Get help right away if:  You develop a severe headache or confusion.  You have unusual weakness or numbness, or you feel faint.  You have severe pain in your chest or abdomen.  You vomit repeatedly.  You have trouble breathing. Summary  Hypertension is when the force of blood pumping through  your arteries is too strong. If this condition is not controlled, it may put you at risk for serious complications.  Your personal target blood pressure may vary depending on your medical conditions, your age, and other factors. For most people, a normal blood pressure is less than 120/80.  Hypertension is managed by lifestyle changes, medicines, or both. Lifestyle changes include weight loss, eating a healthy, low-sodium diet, exercising more, and limiting alcohol. This information is not intended to replace advice given to you by your health care provider. Make sure you discuss any questions you have with your health care provider. Document Released: 05/19/2012 Document Revised: 07/23/2016 Document Reviewed: 07/23/2016 Elsevier Interactive Patient Education  2018 [employer]  

## 2018-01-12 ENCOUNTER — Encounter: Payer: Self-pay | Admitting: Family Medicine

## 2018-01-12 LAB — BASIC METABOLIC PANEL
BUN / CREAT RATIO: 15 (ref 9–20)
BUN: 14 mg/dL (ref 6–24)
CO2: 19 mmol/L — ABNORMAL LOW (ref 20–29)
Calcium: 9.5 mg/dL (ref 8.7–10.2)
Chloride: 102 mmol/L (ref 96–106)
Creatinine, Ser: 0.94 mg/dL (ref 0.76–1.27)
GFR calc Af Amer: 105 mL/min/{1.73_m2} (ref >59)
GFR calc non Af Amer: 91 mL/min/{1.73_m2} (ref >59)
GLUCOSE: 120 mg/dL — AB (ref 65–99)
POTASSIUM: 4.1 mmol/L (ref 3.5–5.2)
SODIUM: 140 mmol/L (ref 134–144)

## 2018-01-13 ENCOUNTER — Telehealth: Payer: Self-pay | Admitting: *Deleted

## 2018-01-13 DIAGNOSIS — Z87891 Personal history of nicotine dependence: Secondary | ICD-10-CM

## 2018-01-13 DIAGNOSIS — Z122 Encounter for screening for malignant neoplasm of respiratory organs: Secondary | ICD-10-CM

## 2018-01-13 NOTE — Telephone Encounter (Signed)
Received referral for initial lung cancer screening scan. Contacted patient and obtained smoking history,(current, 36 pack year) as well as answering questions related to screening process. Patient denies signs of lung cancer such as weight loss or hemoptysis. Patient denies comorbidity that would prevent curative treatment if lung cancer were found. Patient is scheduled for shared decision making visit and CT scan on 01/21/18.

## 2018-01-21 ENCOUNTER — Inpatient Hospital Stay: Payer: Medicaid Other | Attending: Nurse Practitioner | Admitting: Nurse Practitioner

## 2018-01-21 ENCOUNTER — Ambulatory Visit
Admission: RE | Admit: 2018-01-21 | Discharge: 2018-01-21 | Disposition: A | Discharge status: Home / Self Care | Payer: Medicaid Other | Source: Ambulatory Visit | Attending: Nurse Practitioner | Admitting: Nurse Practitioner

## 2018-01-21 ENCOUNTER — Encounter: Payer: Self-pay | Admitting: Nurse Practitioner

## 2018-01-21 DIAGNOSIS — Z87891 Personal history of nicotine dependence: Secondary | ICD-10-CM

## 2018-01-21 DIAGNOSIS — Z122 Encounter for screening for malignant neoplasm of respiratory organs: Secondary | ICD-10-CM | POA: Diagnosis not present

## 2018-01-21 DIAGNOSIS — I2510 Atherosclerotic heart disease of native coronary artery without angina pectoris: Secondary | ICD-10-CM | POA: Insufficient documentation

## 2018-01-21 DIAGNOSIS — I700 Atherosclerosis of aorta: Secondary | ICD-10-CM | POA: Insufficient documentation

## 2018-01-21 NOTE — Progress Notes (Signed)
In accordance with CMS guidelines, patient has met eligibility criteria including age, absence of signs or symptoms of lung cancer.  Social History   Tobacco Use  . Smoking status: Current Every Day Smoker    Packs/day: 1.00    Years: 36.00    Pack years: 36.00    Types: Cigarettes  . Smokeless tobacco: Never Used  . Tobacco comment: .75ppd currently  Substance Use Topics  . Alcohol use: Yes    Alcohol/week: 2.4 oz    Types: 4 Cans of beer per week    Comment: 1/2 pint to 1pint  . Drug use: Yes    Types: Marijuana      A shared decision-making session was conducted prior to the performance of CT scan. This includes one or more decision aids, includes benefits and harms of screening, follow-up diagnostic testing, over-diagnosis, false positive rate, and total radiation exposure.   Counseling on the importance of adherence to annual lung cancer LDCT screening, impact of co-morbidities, and ability or willingness to undergo diagnosis and treatment is imperative for compliance of the program.   Counseling on the importance of continued smoking cessation for former smokers; the importance of smoking cessation for current smokers, and information about tobacco cessation interventions have been given to patient including [place] and [facility] quit [place] programs.   Written order for lung cancer screening with LDCT has been given to the patient and any and all questions have been answered to the best of my abilities.    Yearly follow up will be coordinated by Burgess Estelle, Thoracic Navigator.  Beckey Rutter, DNP, AGNP-C [place] [facility] [phone] (work cell) [phone] (office) 01/21/18 2:50 PM

## 2018-01-22 ENCOUNTER — Encounter: Payer: Self-pay | Admitting: *Deleted

## 2018-01-25 ENCOUNTER — Encounter: Payer: Self-pay | Admitting: Family Medicine

## 2018-01-25 DIAGNOSIS — I2510 Atherosclerotic heart disease of native coronary artery without angina pectoris: Secondary | ICD-10-CM | POA: Insufficient documentation

## 2018-02-11 ENCOUNTER — Encounter: Payer: Self-pay | Admitting: Family Medicine

## 2018-02-11 ENCOUNTER — Ambulatory Visit (INDEPENDENT_AMBULATORY_CARE_PROVIDER_SITE_OTHER): Payer: Medicaid Other | Admitting: Family Medicine

## 2018-02-11 VITALS — BP 153/90 | HR 94 | Temp 98.3°F | Wt 246.6 lb

## 2018-02-11 DIAGNOSIS — I10 Essential (primary) hypertension: Secondary | ICD-10-CM

## 2018-02-11 DIAGNOSIS — M545 Low back pain, unspecified: Secondary | ICD-10-CM

## 2018-02-11 DIAGNOSIS — J449 Chronic obstructive pulmonary disease, unspecified: Secondary | ICD-10-CM

## 2018-02-11 MED ORDER — FLUTICASONE-SALMETEROL 250-50 MCG/DOSE IN AEPB: 1.0000 | INHALATION_SPRAY | Freq: Two times a day (BID) | RESPIRATORY_TRACT | 3 refills | Status: DC

## 2018-02-11 MED ORDER — NAPROXEN 500 MG PO TABS: 500.0000 mg | ORAL_TABLET | Freq: Two times a day (BID) | ORAL | 0 refills | Status: DC

## 2018-02-11 MED ORDER — CYCLOBENZAPRINE HCL 10 MG PO TABS: 10.0000 mg | ORAL_TABLET | Freq: Every day | ORAL | 0 refills | Status: DC

## 2018-02-11 MED ORDER — LISINOPRIL 20 MG PO TABS: 20.0000 mg | ORAL_TABLET | Freq: Every day | ORAL | 3 refills | Status: DC

## 2018-02-11 MED ORDER — ALBUTEROL SULFATE HFA 108 (90 BASE) MCG/ACT IN AERS: 2.0000 | INHALATION_SPRAY | Freq: Four times a day (QID) | RESPIRATORY_TRACT | 12 refills | Status: DC | PRN

## 2018-02-11 NOTE — Assessment & Plan Note (Signed)
Not under great control. Will increase lisinopril to 20mg  and recheck in 1 month. Call with any concerns.

## 2018-02-11 NOTE — Patient Instructions (Signed)

## 2018-02-11 NOTE — Assessment & Plan Note (Signed)
Under good control on current regimen. Continue current regimen. Continue to monitor. Call with any concerns. Refills given.   

## 2018-02-11 NOTE — Progress Notes (Signed)
BP (!) 153/90 (BP Location: Left Arm, Patient Position: Sitting, Cuff Size: Large)   Pulse 94   Temp 98.3 F (36.8 C)   Wt 246 lb 9 oz (111.8 kg)   SpO2 96%   BMI 36.41 kg/m    Subjective:    Patient ID: Jimmy Simmons, male    DOB: 1961-01-23, 57 y.o.   MRN: [id number]  HPI: Jimmy Simmons is a 57 y.o. male  Chief Complaint  Patient presents with  . Hypertension  . Back Pain  . COPD    patient would like a refill on his inhalers   HYPERTENSION- did not take his blood pressure medicine today Hypertension status: uncontrolled  Satisfied with current treatment? no Duration of hypertension: chronic BP monitoring frequency:  not checking BP medication side effects:  no Medication compliance: fair compliance Previous BP meds: lisinopril Aspirin: no Recurrent headaches: no Visual changes: no Palpitations: no Dyspnea: no Chest pain: no Lower extremity edema: no Dizzy/lightheaded: no  BACK PAIN- carried a washing machine up a flight of steps over [place] Duration: 2 weeks Mechanism of injury: lifting Location: R>L and low back Onset: sudden Severity: severe Quality: dull and aching Frequency: constant Radiation: none Aggravating factors: lifting, movement, bending and coughing Alleviating factors: rest, laying, NSAIDs and muscle relaxer Status: stable Treatments attempted: rest, ice, heat, ibuprofen and aleve  Relief with NSAIDs?: mild Nighttime pain:  no Paresthesias / decreased sensation:  no Bowel / bladder incontinence:  no Fevers:  no Dysuria / urinary frequency:  no  COPD COPD status: stable Satisfied with current treatment?: yes Oxygen use: no Dyspnea frequency: increased with back pain Cough frequency: occasionally Rescue inhaler frequency:  rarely Limitation of activity: no Productive cough: no Pneumovax: Up to Date Influenza: Up to Date  Relevant past medical, surgical, family and social history reviewed and updated as indicated. Interim  medical history since our last visit reviewed. Allergies and medications reviewed and updated.  Review of Systems  Constitutional: Negative.   HENT: Negative.   Respiratory: Negative.   Cardiovascular: Negative.   Musculoskeletal: Positive for back pain and myalgias. Negative for arthralgias, gait problem, joint swelling, neck pain and neck stiffness.  Skin: Negative.   Neurological: Negative.   Psychiatric/Behavioral: Negative.     Per HPI unless specifically indicated above     Objective:    BP (!) 153/90 (BP Location: Left Arm, Patient Position: Sitting, Cuff Size: Large)   Pulse 94   Temp 98.3 F (36.8 C)   Wt 246 lb 9 oz (111.8 kg)   SpO2 96%   BMI 36.41 kg/m   Wt Readings from Last 3 Encounters:  02/11/18 246 lb 9 oz (111.8 kg)  01/21/18 240 lb (108.9 kg)  01/11/18 249 lb 5 oz (113.1 kg)    Physical Exam  Constitutional: He is oriented to person, place, and time. He appears well-developed and well-nourished. No distress.  HENT:  Head: Normocephalic and atraumatic.  Right Ear: Hearing normal.  Left Ear: Hearing normal.  Nose: Nose normal.  Eyes: Conjunctivae and lids are normal. Right eye exhibits no discharge. Left eye exhibits no discharge. No scleral icterus.  Cardiovascular: Normal rate, regular rhythm, normal heart sounds and intact distal pulses. Exam reveals no gallop and no friction rub.  No murmur heard. Pulmonary/Chest: Effort normal and breath sounds normal. No stridor. No respiratory distress. He has no wheezes. He has no rales. He exhibits no tenderness.  Neurological: He is alert and oriented to person, place, and time.  Skin:  Skin is warm, dry and intact. Capillary refill takes less than 2 seconds. No rash noted. He is not diaphoretic. No erythema. No pallor.  Psychiatric: He has a normal mood and affect. His speech is normal and behavior is normal. Judgment and thought content normal. Cognition and memory are normal.  Back Exam:    Inspection:   Normal spinal curvature.  No deformity, ecchymosis, erythema, or lesions     Palpation:     Midline spinal tenderness: no      Paralumbar tenderness: yes Right     Parathoracic tenderness: no      Buttocks tenderness: no     Range of Motion:      Flexion: Fingers to Knees     Extension:Decreased     Lateral bending:Decreased    Rotation:Decreased    Neuro Exam:Lower extremity DTRs normal & symmetric.  Strength and sensation intact.    Special Tests:      Straight leg raise:negative  Results for orders placed or performed in visit on 09/32/67  Basic metabolic panel  Result Value Ref Range   Glucose 120 (H) 65 - 99 mg/dL   BUN 14 6 - 24 mg/dL   Creatinine, Ser 0.94 0.76 - 1.27 mg/dL   GFR calc non Af Amer 91 >59 mL/min/1.73   GFR calc Af Amer 105 >59 mL/min/1.73   BUN/Creatinine Ratio 15 9 - 20   Sodium 140 134 - 144 mmol/L   Potassium 4.1 3.5 - 5.2 mmol/L   Chloride 102 96 - 106 mmol/L   CO2 19 (L) 20 - 29 mmol/L   Calcium 9.5 8.7 - 10.2 mg/dL  Microalbumin, Urine Waived  Result Value Ref Range   Microalb, Ur Waived 10 0 - 19 mg/L   Creatinine, Urine Waived 100 10 - 300 mg/dL   Microalb/Creat Ratio <30 <30 mg/g      Assessment & Plan:   Problem List Items Addressed This Visit      Cardiovascular and Mediastinum   HTN (hypertension)    Not under great control. Will increase lisinopril to 20mg  and recheck in 1 month. Call with any concerns.       Relevant Medications   lisinopril (PRINIVIL,ZESTRIL) 20 MG tablet     Respiratory   COPD (chronic obstructive pulmonary disease) ([place])    Under good control on current regimen. Continue current regimen. Continue to monitor. Call with any concerns. Refills given.       Relevant Medications   Fluticasone-Salmeterol (ADVAIR DISKUS) 250-50 MCG/DOSE AEPB   albuterol (PROVENTIL HFA;VENTOLIN HFA) 108 (90 Base) MCG/ACT inhaler    Other Visit Diagnoses    Acute right-sided low back pain without sciatica    -  Primary   Will  treat with flexeril and naproxen. Exercises given. Call with any concerns. Continue to monitor.    Relevant Medications   cyclobenzaprine (FLEXERIL) 10 MG tablet   naproxen (NAPROSYN) 500 MG tablet       Follow up plan: Return in about 1 month (around 03/11/2018) for follow up BP and back.

## 2018-03-16 ENCOUNTER — Ambulatory Visit (INDEPENDENT_AMBULATORY_CARE_PROVIDER_SITE_OTHER): Payer: Medicaid Other | Admitting: Family Medicine

## 2018-03-16 ENCOUNTER — Other Ambulatory Visit: Payer: Self-pay

## 2018-03-16 ENCOUNTER — Encounter: Payer: Self-pay | Admitting: Family Medicine

## 2018-03-16 VITALS — BP 150/89 | HR 72 | Temp 98.4°F | Ht 69.0 in | Wt 243.1 lb

## 2018-03-16 DIAGNOSIS — I10 Essential (primary) hypertension: Secondary | ICD-10-CM

## 2018-03-16 DIAGNOSIS — B182 Chronic viral hepatitis C: Secondary | ICD-10-CM | POA: Diagnosis not present

## 2018-03-16 MED ORDER — HYDROCHLOROTHIAZIDE 25 MG PO TABS: 25.0000 mg | ORAL_TABLET | Freq: Every day | ORAL | 3 refills | Status: DC

## 2018-03-16 NOTE — Assessment & Plan Note (Signed)
Did not take his medicine today, but has been running high. Will add HCTZ and recheck in 1 month. Call with any concerns. Encouraged patient to take his medicine daily!

## 2018-03-16 NOTE — Assessment & Plan Note (Signed)
Stopped his milk thistle- will recheck CMP today. Call with any concerns.

## 2018-03-16 NOTE — Progress Notes (Signed)
BP (!) 150/89   Pulse 72   Temp 98.4 F (36.9 C) (Oral)   Ht 5\' 9"  (1.753 m)   Wt 243 lb 1.6 oz (110.3 kg)   SpO2 98%   BMI 35.90 kg/m    Subjective:    Patient ID: Jimmy Simmons, male    DOB: [birth year], 57 y.o.   MRN: [id number]  HPI: Jimmy Simmons is a 57 y.o. male  Chief Complaint  Patient presents with  . Hypertension   Back is better.   HYPERTENSION- hasn't taken his blood pressure in about 2 days Hypertension status: stable  Satisfied with current treatment? no Duration of hypertension: chronic BP monitoring frequency:  a few times a week BP range: 140s-150s/90s BP medication side effects:  no Medication compliance: fair compliance Previous BP meds: lisinopril Aspirin: no Recurrent headaches: no Visual changes: no Palpitations: no Dyspnea: no Chest pain: no Lower extremity edema: no Dizzy/lightheaded: no  Relevant past medical, surgical, family and social history reviewed and updated as indicated. Interim medical history since our last visit reviewed. Allergies and medications reviewed and updated.  Review of Systems  Constitutional: Negative.   Respiratory: Negative.   Cardiovascular: Negative.   Gastrointestinal: Positive for diarrhea. Negative for abdominal distention, abdominal pain, anal bleeding, blood in stool, constipation, nausea, rectal pain and vomiting.  Neurological: Negative.   Psychiatric/Behavioral: Negative.     Per HPI unless specifically indicated above     Objective:    BP (!) 150/89   Pulse 72   Temp 98.4 F (36.9 C) (Oral)   Ht 5\' 9"  (1.753 m)   Wt 243 lb 1.6 oz (110.3 kg)   SpO2 98%   BMI 35.90 kg/m   Wt Readings from Last 3 Encounters:  03/16/18 243 lb 1.6 oz (110.3 kg)  02/11/18 246 lb 9 oz (111.8 kg)  01/21/18 240 lb (108.9 kg)    Physical Exam  Constitutional: He is oriented to person, place, and time. He appears well-developed and well-nourished. No distress.  HENT:  Head: Normocephalic and atraumatic.  Right  Ear: Hearing normal.  Left Ear: Hearing normal.  Nose: Nose normal.  Eyes: Conjunctivae and lids are normal. Right eye exhibits no discharge. Left eye exhibits no discharge. No scleral icterus.  Cardiovascular: Normal rate, regular rhythm, normal heart sounds and intact distal pulses. Exam reveals no gallop and no friction rub.  No murmur heard. Pulmonary/Chest: Effort normal and breath sounds normal. No stridor. No respiratory distress. He has no wheezes. He has no rales. He exhibits no tenderness.  Musculoskeletal: Normal range of motion.  Neurological: He is alert and oriented to person, place, and time.  Skin: Skin is warm, dry and intact. Capillary refill takes less than 2 seconds. No rash noted. He is not diaphoretic. No erythema. No pallor.  Psychiatric: He has a normal mood and affect. His speech is normal and behavior is normal. Judgment and thought content normal. Cognition and memory are normal.  Nursing note and vitals reviewed.   Results for orders placed or performed in visit on 69/45/03  Basic metabolic panel  Result Value Ref Range   Glucose 120 (H) 65 - 99 mg/dL   BUN 14 6 - 24 mg/dL   Creatinine, Ser 0.94 0.76 - 1.27 mg/dL   GFR calc non Af Amer 91 >59 mL/min/1.73   GFR calc Af Amer 105 >59 mL/min/1.73   BUN/Creatinine Ratio 15 9 - 20   Sodium 140 134 - 144 mmol/L   Potassium 4.1 3.5 - 5.2  mmol/L   Chloride 102 96 - 106 mmol/L   CO2 19 (L) 20 - 29 mmol/L   Calcium 9.5 8.7 - 10.2 mg/dL  Microalbumin, Urine Waived  Result Value Ref Range   Microalb, Ur Waived 10 0 - 19 mg/L   Creatinine, Urine Waived 100 10 - 300 mg/dL   Microalb/Creat Ratio <30 <30 mg/g      Assessment & Plan:   Problem List Items Addressed This Visit      Cardiovascular and Mediastinum   HTN (hypertension) - Primary    Did not take his medicine today, but has been running high. Will add HCTZ and recheck in 1 month. Call with any concerns. Encouraged patient to take his medicine daily!        Relevant Medications   hydrochlorothiazide (HYDRODIURIL) 25 MG tablet   Other Relevant Orders   Comprehensive metabolic panel     Digestive   Hepatitis C    Stopped his milk thistle- will recheck CMP today. Call with any concerns.       Relevant Orders   Comprehensive metabolic panel       Follow up plan: Return in about 1 month (around 04/13/2018).

## 2018-03-17 ENCOUNTER — Encounter: Payer: Self-pay | Admitting: Family Medicine

## 2018-03-17 LAB — COMPREHENSIVE METABOLIC PANEL
A/G RATIO: 1.6 (ref 1.2–2.2)
ALT: 49 IU/L — ABNORMAL HIGH (ref 0–44)
AST: 37 IU/L (ref 0–40)
Albumin: 4.5 g/dL (ref 3.5–5.5)
Alkaline Phosphatase: 77 IU/L (ref 39–117)
BUN/Creatinine Ratio: 13 (ref 9–20)
BUN: 12 mg/dL (ref 6–24)
Bilirubin Total: 0.3 mg/dL (ref 0.0–1.2)
CALCIUM: 9.6 mg/dL (ref 8.7–10.2)
CO2: 19 mmol/L — ABNORMAL LOW (ref 20–29)
Chloride: 103 mmol/L (ref 96–106)
Creatinine, Ser: 0.93 mg/dL (ref 0.76–1.27)
GFR, EST AFRICAN AMERICAN: 106 mL/min/{1.73_m2} (ref >59)
GFR, EST NON AFRICAN AMERICAN: 92 mL/min/{1.73_m2} (ref >59)
GLOBULIN, TOTAL: 2.8 g/dL (ref 1.5–4.5)
Glucose: 109 mg/dL — ABNORMAL HIGH (ref 65–99)
POTASSIUM: 4.7 mmol/L (ref 3.5–5.2)
SODIUM: 140 mmol/L (ref 134–144)
TOTAL PROTEIN: 7.3 g/dL (ref 6.0–8.5)

## 2018-05-03 ENCOUNTER — Ambulatory Visit: Payer: Medicaid Other | Admitting: Family Medicine

## 2018-05-03 ENCOUNTER — Encounter: Payer: Self-pay | Admitting: Family Medicine

## 2018-05-03 VITALS — BP 92/62 | HR 77 | Temp 97.5°F | Wt 241.4 lb

## 2018-05-03 DIAGNOSIS — R1011 Right upper quadrant pain: Secondary | ICD-10-CM

## 2018-05-03 DIAGNOSIS — Z23 Encounter for immunization: Secondary | ICD-10-CM | POA: Diagnosis not present

## 2018-05-03 DIAGNOSIS — I10 Essential (primary) hypertension: Secondary | ICD-10-CM | POA: Diagnosis not present

## 2018-05-03 MED ORDER — LISINOPRIL-HYDROCHLOROTHIAZIDE 20-25 MG PO TABS: 1.0000 | ORAL_TABLET | Freq: Every day | ORAL | 1 refills | Status: DC

## 2018-05-03 NOTE — Assessment & Plan Note (Signed)
Under good control on current regimen. BP slightly low due to muscle relaxer taken right before appointment. Continue diet and exercise. Call with any concerns. Recheck 6 months.

## 2018-05-03 NOTE — Progress Notes (Addendum)
BP 92/62 (BP Location: Left Arm, Patient Position: Sitting, Cuff Size: Large)   Pulse 77   Temp (!) 97.5 F (36.4 C)   Wt 241 lb 7 oz (109.5 kg)   SpO2 94%   BMI 35.65 kg/m    Subjective:    Patient ID: Jimmy Simmons, male    DOB: 1961/07/10, 57 y.o.   MRN: [id number]  HPI: Jimmy Simmons is a 57 y.o. male  Chief Complaint  Patient presents with  . Hypertension   HYPERTENSION- has been fatigued today because he took a muscle relaxer this AM.  Hypertension status: controlled  Satisfied with current treatment? yes Duration of hypertension: chronic BP monitoring frequency:  not checking BP medication side effects:  no Medication compliance: excellent compliance Previous BP meds: lisinopril-hctz Aspirin: no Recurrent headaches: no Visual changes: no Palpitations: no Dyspnea: no Chest pain: no Lower extremity edema: no Dizzy/lightheaded: no  Has been having pain in his RUQ when he is very active. Not associated with eating. Seems to come on when he's working outside. He thinks that this is due to hernia. Would like to see surgery.  Relevant past medical, surgical, family and social history reviewed and updated as indicated. Interim medical history since our last visit reviewed. Allergies and medications reviewed and updated.  Review of Systems  Constitutional: Negative.   Respiratory: Negative.   Cardiovascular: Negative.   Neurological: Negative.   Psychiatric/Behavioral: Negative.     Per HPI unless specifically indicated above     Objective:    BP 92/62 (BP Location: Left Arm, Patient Position: Sitting, Cuff Size: Large)   Pulse 77   Temp (!) 97.5 F (36.4 C)   Wt 241 lb 7 oz (109.5 kg)   SpO2 94%   BMI 35.65 kg/m   Wt Readings from Last 3 Encounters:  05/03/18 241 lb 7 oz (109.5 kg)  03/16/18 243 lb 1.6 oz (110.3 kg)  02/11/18 246 lb 9 oz (111.8 kg)    Orthostatic VS for the past 24 hrs:  BP- Lying Pulse- Lying BP- Sitting Pulse- Sitting BP- Standing at  0 minutes Pulse- Standing at 0 minutes  05/03/18 1058 (!) 87/59 68 147/73 108 117/75 76    Physical Exam  Constitutional: He is oriented to person, place, and time. He appears well-developed and well-nourished. No distress.  HENT:  Head: Normocephalic and atraumatic.  Right Ear: Hearing normal.  Left Ear: Hearing normal.  Nose: Nose normal.  Eyes: Conjunctivae and lids are normal. Right eye exhibits no discharge. Left eye exhibits no discharge. No scleral icterus.  Cardiovascular: Normal rate, regular rhythm, normal heart sounds and intact distal pulses. Exam reveals no gallop and no friction rub.  No murmur heard. Pulmonary/Chest: Effort normal and breath sounds normal. No stridor. No respiratory distress. He has no wheezes. He has no rales. He exhibits no tenderness.  Abdominal: Soft. Bowel sounds are normal. He exhibits no distension and no mass. There is tenderness. There is no rebound and no guarding. No hernia.  Spasm to diaphragm on the R  Musculoskeletal: Normal range of motion.  Neurological: He is alert and oriented to person, place, and time.  Skin: Skin is warm, dry and intact. Capillary refill takes less than 2 seconds. No rash noted. He is not diaphoretic. No erythema. No pallor.  Psychiatric: He has a normal mood and affect. His speech is normal and behavior is normal. Judgment and thought content normal. Cognition and memory are normal.  Nursing note and vitals reviewed.  Results for orders placed or performed in visit on 03/16/18  Comprehensive metabolic panel  Result Value Ref Range   Glucose 109 (H) 65 - 99 mg/dL   BUN 12 6 - 24 mg/dL   Creatinine, Ser 0.93 0.76 - 1.27 mg/dL   GFR calc non Af Amer 92 >59 mL/min/1.73   GFR calc Af Amer 106 >59 mL/min/1.73   BUN/Creatinine Ratio 13 9 - 20   Sodium 140 134 - 144 mmol/L   Potassium 4.7 3.5 - 5.2 mmol/L   Chloride 103 96 - 106 mmol/L   CO2 19 (L) 20 - 29 mmol/L   Calcium 9.6 8.7 - 10.2 mg/dL   Total Protein 7.3  6.0 - 8.5 g/dL   Albumin 4.5 3.5 - 5.5 g/dL   Globulin, Total 2.8 1.5 - 4.5 g/dL   Albumin/Globulin Ratio 1.6 1.2 - 2.2   Bilirubin Total 0.3 0.0 - 1.2 mg/dL   Alkaline Phosphatase 77 39 - 117 IU/L   AST 37 0 - 40 IU/L   ALT 49 (H) 0 - 44 IU/L      Assessment & Plan:   Problem List Items Addressed This Visit      Cardiovascular and Mediastinum   HTN (hypertension) - Primary    Under good control on current regimen. BP slightly low due to muscle relaxer taken right before appointment. Continue diet and exercise. Call with any concerns. Recheck 6 months.       Relevant Medications   lisinopril-hydrochlorothiazide (PRINZIDE,ZESTORETIC) 20-25 MG tablet   Other Relevant Orders   Basic metabolic panel    Other Visit Diagnoses    Immunization due       Flu shot given today.   Relevant Orders   Flu Vaccine QUAD 6+ mos PF IM (Fluarix Quad PF) (Completed)   RUQ pain       ?Diaphragm spasm. Will refer to general surgery for evaluation. Call with any concerns.    Relevant Orders   Ambulatory referral to General Surgery       Follow up plan: Return in about 6 months (around 11/03/2018) for Physical (after 3/5).

## 2018-05-03 NOTE — Patient Instructions (Signed)

## 2018-05-03 NOTE — Addendum Note (Signed)
Addended by: Valerie Roys on: 05/03/2018 11:19 AM   Modules accepted: Orders, Level of Service

## 2018-05-04 ENCOUNTER — Encounter: Payer: Self-pay | Admitting: Family Medicine

## 2018-05-04 LAB — BASIC METABOLIC PANEL
BUN/Creatinine Ratio: 19 (ref 9–20)
BUN: 22 mg/dL (ref 6–24)
CHLORIDE: 98 mmol/L (ref 96–106)
CO2: 20 mmol/L (ref 20–29)
Calcium: 9.7 mg/dL (ref 8.7–10.2)
Creatinine, Ser: 1.14 mg/dL (ref 0.76–1.27)
GFR calc Af Amer: 83 mL/min/{1.73_m2} (ref >59)
GFR calc non Af Amer: 72 mL/min/{1.73_m2} (ref >59)
Glucose: 96 mg/dL (ref 65–99)
Potassium: 4.4 mmol/L (ref 3.5–5.2)
Sodium: 137 mmol/L (ref 134–144)

## 2018-05-11 ENCOUNTER — Ambulatory Visit: Payer: Medicaid Other | Admitting: Surgery

## 2018-05-11 ENCOUNTER — Encounter: Payer: Self-pay | Admitting: Surgery

## 2018-05-11 VITALS — BP 132/90 | Temp 97.8°F | Ht 69.0 in | Wt 242.0 lb

## 2018-05-11 DIAGNOSIS — R109 Unspecified abdominal pain: Secondary | ICD-10-CM

## 2018-05-11 DIAGNOSIS — K429 Umbilical hernia without obstruction or gangrene: Secondary | ICD-10-CM
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
## 2018-05-11 NOTE — Patient Instructions (Addendum)
Umbilical Hernia, Adult A hernia is a bulge of tissue that pushes through an opening between muscles. An umbilical hernia happens in the abdomen, near the belly button (umbilicus). The hernia may contain tissues from the small intestine, large intestine, or fatty tissue covering the intestines (omentum). Umbilical hernias in adults tend to get worse over time, and they require surgical treatment. There are several types of umbilical hernias. You may have:  A hernia located just above or below the umbilicus (indirect hernia). This is the most common type of umbilical hernia in adults.  A hernia that forms through an opening formed by the umbilicus (direct hernia).  A hernia that comes and goes (reducible hernia). A reducible hernia may be visible only when you strain, lift something heavy, or cough. This type of hernia can be pushed back into the abdomen (reduced).  A hernia that traps abdominal tissue inside the hernia (incarcerated hernia). This type of hernia cannot be reduced.  A hernia that cuts off blood flow to the tissues inside the hernia (strangulated hernia). The tissues can start to die if this happens. This type of hernia requires emergency treatment.  What are the causes? An umbilical hernia happens when tissue inside the abdomen presses on a weak area of the abdominal muscles. What increases the risk? You may have a greater risk of this condition if you:  Are obese.  Have had several pregnancies.  Have a buildup of fluid inside your abdomen (ascites).  Have had surgery that weakens the abdominal muscles.  What are the signs or symptoms? The main symptom of this condition is a painless bulge at or near the belly button. A reducible hernia may be visible only when you strain, lift something heavy, or cough. Other symptoms may include:  Dull pain.  A feeling of pressure.  Symptoms of a strangulated hernia may include:  Pain that gets increasingly worse.  Nausea and  vomiting.  Pain when pressing on the hernia.  Skin over the hernia becoming red or purple.  Constipation.  Blood in the stool.  How is this diagnosed? This condition may be diagnosed based on:  A physical exam. You may be asked to cough or strain while standing. These actions increase the pressure inside your abdomen and force the hernia through the opening in your muscles. Your health care provider may try to reduce the hernia by pressing on it.  Your symptoms and medical history.  How is this treated? Surgery is the only treatment for an umbilical hernia. Surgery for a strangulated hernia is done as soon as possible. If you have a small hernia that is not incarcerated, you may need to lose weight before having surgery. Follow these instructions at home:  Lose weight, if told by your health care provider.  Do not try to push the hernia back in.  Watch your hernia for any changes in color or size. Tell your health care provider if any changes occur.  You may need to avoid activities that increase pressure on your hernia.  Do not lift anything that is heavier than 10 lb (4.5 kg) until your health care provider says that this is safe.  Take over-the-counter and prescription medicines only as told by your health care provider.  Keep all follow-up visits as told by your health care provider. This is important. Contact a health care provider if:  Your hernia gets larger.  Your hernia becomes painful. Get help right away if:  You develop sudden, severe pain near the   area of your hernia.  You have pain as well as nausea or vomiting.  You have pain and the skin over your hernia changes color.  You develop a fever. This information is not intended to replace advice given to you by your health care provider. Make sure you discuss any questions you have with your health care provider. Document Released: 01/25/2016 Document Revised: 04/27/2016 Document Reviewed:  01/25/2016 Elsevier Interactive Patient Education  2018 [place] with Quitting Smoking Quitting smoking is a physical and mental challenge. You will face cravings, withdrawal symptoms, and temptation. Before quitting, work with your health care provider to make a plan that can help you cope. Preparation can help you quit and keep you from giving in. How can I cope with cravings? Cravings usually last for 5-10 minutes. If you get through it, the craving will pass. Consider taking the following actions to help you cope with cravings:  Keep your mouth busy: ? Chew sugar-free gum. ? Suck on hard candies or a straw. ? Brush your teeth.  Keep your hands and body busy: ? Immediately change to a different activity when you feel a craving. ? Squeeze or play with a ball. ? Do an activity or a hobby, like making bead jewelry, practicing needlepoint, or working with wood. ? Mix up your normal routine. ? Take a short exercise break. Go for a quick walk or run up and down stairs. ? Spend time in public places where smoking is not allowed.  Focus on doing something kind or helpful for someone else.  Call a friend or family member to talk during a craving.  Join a support group.  Call a quit line, such as 1-800-QUIT-NOW.  Talk with your health care provider about medicines that might help you cope with cravings and make quitting easier for you.  How can I deal with withdrawal symptoms? Your body may experience negative effects as it tries to get used to not having nicotine in the system. These effects are called withdrawal symptoms. They may include:  Feeling hungrier than normal.  Trouble concentrating.  Irritability.  Trouble sleeping.  Feeling depressed.  Restlessness and agitation.  Craving a cigarette.  To manage withdrawal symptoms:  Avoid places, people, and activities that trigger your cravings.  Remember why you want to quit.  Get plenty of sleep.  Avoid  coffee and other caffeinated drinks. These may worsen some of your symptoms.  How can I handle social situations? Social situations can be difficult when you are quitting smoking, especially in the first few weeks. To manage this, you can:  Avoid parties, bars, and other social situations where people might be smoking.  Avoid alcohol.  Leave right away if you have the urge to smoke.  Explain to your family and friends that you are quitting smoking. Ask for understanding and support.  Plan activities with friends or family where smoking is not an option.  What are some ways I can cope with stress? Wanting to smoke may cause stress, and stress can make you want to smoke. Find ways to manage your stress. Relaxation techniques can help. For example:  Breathe slowly and deeply, in through your nose and out through your mouth.  Listen to soothing, relaxing music.  Talk with a family member or friend about your stress.  Light a candle.  Soak in a bath or take a shower.  Think about a peaceful place.  What are some ways I can prevent weight gain? Be aware  that many people gain weight after they quit smoking. However, not everyone does. To keep from gaining weight, have a plan in place before you quit and stick to the plan after you quit. Your plan should include:  Having healthy snacks. When you have a craving, it may help to: ? Eat plain popcorn, crunchy carrots, celery, or other cut vegetables. ? Chew sugar-free gum.  Changing how you eat: ? Eat small portion sizes at meals. ? Eat 4-6 small meals throughout the day instead of 1-2 large meals a day. ? Be mindful when you eat. Do not watch television or do other things that might distract you as you eat.  Exercising regularly: ? Make time to exercise each day. If you do not have time for a long workout, do short bouts of exercise for 5-10 minutes several times a day. ? Do some form of strengthening exercise, like weight lifting,  and some form of aerobic exercise, like running or swimming.  Drinking plenty of water or other low-calorie or no-calorie drinks. Drink 6-8 glasses of water daily, or as much as instructed by your health care provider.  Summary  Quitting smoking is a physical and mental challenge. You will face cravings, withdrawal symptoms, and temptation to smoke again. Preparation can help you as you go through these challenges.  You can cope with cravings by keeping your mouth busy (such as by chewing gum), keeping your body and hands busy, and making calls to family, friends, or a helpline for people who want to quit smoking.  You can cope with withdrawal symptoms by avoiding places where people smoke, avoiding drinks with caffeine, and getting plenty of rest.  Ask your health care provider about the different ways to prevent weight gain, avoid stress, and handle social situations. This information is not intended to replace advice given to you by your health care provider. Make sure you discuss any questions you have with your health care provider. Document Released: 08/22/2016 Document Revised: 08/22/2016 Document Reviewed: 08/22/2016 Elsevier Interactive Patient Education  [employer].

## 2018-05-14 ENCOUNTER — Encounter: Payer: Self-pay | Admitting: Surgery

## 2018-11-06 ENCOUNTER — Other Ambulatory Visit: Payer: Self-pay | Admitting: Family Medicine

## 2018-11-12 ENCOUNTER — Other Ambulatory Visit: Payer: Self-pay

## 2018-11-12 ENCOUNTER — Ambulatory Visit (INDEPENDENT_AMBULATORY_CARE_PROVIDER_SITE_OTHER): Payer: Medicaid Other | Admitting: Family Medicine

## 2018-11-12 ENCOUNTER — Encounter: Payer: Self-pay | Admitting: Family Medicine

## 2018-11-12 VITALS — BP 121/77 | HR 72 | Temp 97.9°F | Ht 68.5 in | Wt 252.0 lb

## 2018-11-12 DIAGNOSIS — Z0000 Encounter for general adult medical examination without abnormal findings: Secondary | ICD-10-CM | POA: Diagnosis not present

## 2018-11-12 DIAGNOSIS — B182 Chronic viral hepatitis C: Secondary | ICD-10-CM

## 2018-11-12 DIAGNOSIS — H538 Other visual disturbances: Secondary | ICD-10-CM
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
## 2018-11-12 NOTE — Patient Instructions (Signed)

## 2018-11-12 NOTE — Assessment & Plan Note (Signed)
Checking labs today. Await results. Call with any concerns.  

## 2018-11-13 LAB — CBC WITH DIFFERENTIAL/PLATELET
BASOS: 1 %
Basophils Absolute: 0.1 10*3/uL (ref 0.0–0.2)
EOS (ABSOLUTE): 0.2 10*3/uL (ref 0.0–0.4)
EOS: 2 %
HEMATOCRIT: 45.9 % (ref 37.5–51.0)
Hemoglobin: 15.8 g/dL (ref 13.0–17.7)
Immature Grans (Abs): 0 10*3/uL (ref 0.0–0.1)
Immature Granulocytes: 0 %
LYMPHS ABS: 3 10*3/uL (ref 0.7–3.1)
Lymphs: 34 %
MCH: 33.5 pg — AB (ref 26.6–33.0)
MCHC: 34.4 g/dL (ref 31.5–35.7)
MCV: 97 fL (ref 79–97)
Monocytes Absolute: 0.7 10*3/uL (ref 0.1–0.9)
Monocytes: 7 %
NEUTROS ABS: 5.1 10*3/uL (ref 1.4–7.0)
NEUTROS PCT: 56 %
Platelets: 181 10*3/uL (ref 150–450)
RBC: 4.72 x10E6/uL (ref 4.14–5.80)
RDW: 12.9 % (ref 11.6–15.4)
WBC: 9.1 10*3/uL (ref 3.4–10.8)

## 2018-11-13 LAB — PSA: Prostate Specific Ag, Serum: 1 ng/mL (ref 0.0–4.0)

## 2018-11-13 LAB — COMPREHENSIVE METABOLIC PANEL
A/G RATIO: 1.8 (ref 1.2–2.2)
ALT: 30 IU/L (ref 0–44)
AST: 27 IU/L (ref 0–40)
Albumin: 4.9 g/dL (ref 3.8–4.9)
Alkaline Phosphatase: 66 IU/L (ref 39–117)
BILIRUBIN TOTAL: 0.4 mg/dL (ref 0.0–1.2)
BUN/Creatinine Ratio: 17 (ref 9–20)
BUN: 18 mg/dL (ref 6–24)
CALCIUM: 9.7 mg/dL (ref 8.7–10.2)
CHLORIDE: 95 mmol/L — AB (ref 96–106)
CO2: 20 mmol/L (ref 20–29)
Creatinine, Ser: 1.04 mg/dL (ref 0.76–1.27)
GFR, EST AFRICAN AMERICAN: 92 mL/min/{1.73_m2} (ref >59)
GFR, EST NON AFRICAN AMERICAN: 80 mL/min/{1.73_m2} (ref >59)
GLOBULIN, TOTAL: 2.8 g/dL (ref 1.5–4.5)
Glucose: 135 mg/dL — ABNORMAL HIGH (ref 65–99)
POTASSIUM: 4.1 mmol/L (ref 3.5–5.2)
SODIUM: 135 mmol/L (ref 134–144)
Total Protein: 7.7 g/dL (ref 6.0–8.5)

## 2018-11-13 LAB — LIPID PANEL W/O CHOL/HDL RATIO
Cholesterol, Total: 155 mg/dL (ref 100–199)
HDL: 37 mg/dL — AB (ref >39)
LDL Calculated: 92 mg/dL (ref 0–99)
TRIGLYCERIDES: 131 mg/dL (ref 0–149)
VLDL Cholesterol Cal: 26 mg/dL (ref 5–40)

## 2018-11-13 LAB — TSH: TSH: 1.84 u[IU]/mL (ref 0.450–4.500)

## 2018-11-15 ENCOUNTER — Encounter: Payer: Self-pay | Admitting: Family Medicine

## 2018-11-15 LAB — UA/M W/RFLX CULTURE, ROUTINE
BILIRUBIN UA: NEGATIVE
Glucose, UA: NEGATIVE
KETONES UA: NEGATIVE
NITRITE UA: NEGATIVE
PROTEIN UA: NEGATIVE
RBC UA: NEGATIVE
Specific Gravity, UA: 1.01 (ref 1.005–1.030)
UUROB: 0.2 mg/dL (ref 0.2–1.0)
pH, UA: 7 (ref 5.0–7.5)

## 2018-11-15 LAB — MICROSCOPIC EXAMINATION: RBC, UA: NONE SEEN /hpf (ref 0–2)

## 2018-11-15 LAB — URINE CULTURE, REFLEX

## 2018-12-09 ENCOUNTER — Encounter: Payer: Self-pay | Admitting: *Deleted

## 2019-01-11 ENCOUNTER — Encounter: Payer: Self-pay | Admitting: *Deleted

## 2019-01-19 DIAGNOSIS — H5212 Myopia, left eye: Secondary | ICD-10-CM | POA: Diagnosis not present

## 2019-01-28 ENCOUNTER — Other Ambulatory Visit: Payer: Self-pay

## 2019-01-28 ENCOUNTER — Encounter: Payer: Self-pay | Admitting: Family Medicine

## 2019-01-28 ENCOUNTER — Ambulatory Visit (INDEPENDENT_AMBULATORY_CARE_PROVIDER_SITE_OTHER): Payer: Medicaid Other | Admitting: Family Medicine

## 2019-01-28 VITALS — BP 127/73 | HR 88

## 2019-01-28 DIAGNOSIS — I10 Essential (primary) hypertension: Secondary | ICD-10-CM

## 2019-01-28 DIAGNOSIS — G4700 Insomnia, unspecified: Secondary | ICD-10-CM

## 2019-01-28 MED ORDER — FLUTICASONE-SALMETEROL 250-50 MCG/DOSE IN AEPB: 1.0000 | INHALATION_SPRAY | Freq: Two times a day (BID) | RESPIRATORY_TRACT | 3 refills | Status: DC

## 2019-01-28 MED ORDER — LISINOPRIL-HYDROCHLOROTHIAZIDE 20-25 MG PO TABS: 1.0000 | ORAL_TABLET | Freq: Every day | ORAL | 1 refills | Status: DC

## 2019-01-28 MED ORDER — ALBUTEROL SULFATE HFA 108 (90 BASE) MCG/ACT IN AERS: 2.0000 | INHALATION_SPRAY | Freq: Four times a day (QID) | RESPIRATORY_TRACT | 12 refills | Status: DC | PRN

## 2019-01-28 NOTE — Progress Notes (Signed)
BP 127/73   Pulse 88    Subjective:    Patient ID: Jimmy Simmons, male    DOB: [birth year], 58 y.o.   MRN: [id number]  HPI: Jimmy Simmons is a 58 y.o. male  Chief Complaint  Patient presents with  . Hypertension  . Insomnia    pt states he has been having trouble sleeping   HYPERTENSION Hypertension status: controlled  Satisfied with current treatment? no Duration of hypertension: chronic BP monitoring frequency:  not checking BP medication side effects:  no Medication compliance: excellent compliance Previous BP meds: lisinopril-hctz Aspirin: no Recurrent headaches: no Visual changes: no Palpitations: no Dyspnea: no Chest pain: no Lower extremity edema: no Dizzy/lightheaded: no  INSOMNIA Duration: during the pandemic Satisfied with sleep quality: no Difficulty falling asleep: yes Difficulty staying asleep: no Waking a few hours after sleep onset: no Early morning awakenings: no Daytime hypersomnolence: no Wakes feeling refreshed: no Good sleep hygiene: no Apnea: no Snoring: no Depressed/anxious mood: no Recent stress: yes Restless legs/nocturnal leg cramps: no Chronic pain/arthritis: no History of sleep study: no Treatments attempted: marijuana   Relevant past medical, surgical, family and social history reviewed and updated as indicated. Interim medical history since our last visit reviewed. Allergies and medications reviewed and updated.  Review of Systems  Constitutional: Negative.   Respiratory: Negative.   Cardiovascular: Negative.   Musculoskeletal: Positive for back pain and myalgias. Negative for arthralgias, gait problem, joint swelling, neck pain and neck stiffness.  Skin: Negative.   Psychiatric/Behavioral: Positive for sleep disturbance. Negative for agitation, behavioral problems, confusion, decreased concentration, dysphoric mood, hallucinations, self-injury and suicidal ideas. The patient is not nervous/anxious and is not hyperactive.     Per  HPI unless specifically indicated above     Objective:    BP 127/73   Pulse 88   Wt Readings from Last 3 Encounters:  11/12/18 252 lb (114.3 kg)  05/11/18 242 lb (109.8 kg)  05/03/18 241 lb 7 oz (109.5 kg)    Physical Exam Vitals signs and nursing note reviewed.  Constitutional:      General: He is not in acute distress.    Appearance: Normal appearance. He is not ill-appearing, toxic-appearing or diaphoretic.  HENT:     Head: Normocephalic and atraumatic.     Right Ear: External ear normal.     Left Ear: External ear normal.     Nose: Nose normal.     Mouth/Throat:     Mouth: Mucous membranes are moist.     Pharynx: Oropharynx is clear.  Eyes:     General: No scleral icterus.       Right eye: No discharge.        Left eye: No discharge.     Conjunctiva/sclera: Conjunctivae normal.     Pupils: Pupils are equal, round, and reactive to light.  Neck:     Musculoskeletal: Normal range of motion.  Pulmonary:     Effort: Pulmonary effort is normal. No respiratory distress.     Comments: Speaking in full sentences Musculoskeletal: Normal range of motion.  Skin:    Coloration: Skin is not jaundiced or pale.     Findings: No bruising, erythema, lesion or rash.  Neurological:     Mental Status: He is alert and oriented to person, place, and time. Mental status is at baseline.  Psychiatric:        Mood and Affect: Mood normal.        Behavior: Behavior normal.  Thought Content: Thought content normal.        Judgment: Judgment normal.     Results for orders placed or performed in visit on 11/12/18  Microscopic Examination  Result Value Ref Range   WBC, UA 0-5 0 - 5 /hpf   RBC, UA None seen 0 - 2 /hpf   Epithelial Cells (non renal) 0-10 0 - 10 /hpf   Bacteria, UA Few (A) None seen/Few  Urine Culture, Reflex  Result Value Ref Range   Urine Culture, Routine Final report    Organism ID, Bacteria Comment   CBC with Differential/Platelet  Result Value Ref Range    WBC 9.1 3.4 - 10.8 x10E3/uL   RBC 4.72 4.14 - 5.80 x10E6/uL   Hemoglobin 15.8 13.0 - 17.7 g/dL   Hematocrit 45.9 37.5 - 51.0 %   MCV 97 79 - 97 fL   MCH 33.5 (H) 26.6 - 33.0 pg   MCHC 34.4 31.5 - 35.7 g/dL   RDW 12.9 11.6 - 15.4 %   Platelets 181 150 - 450 x10E3/uL   Neutrophils 56 Not Estab. %   Lymphs 34 Not Estab. %   Monocytes 7 Not Estab. %   Eos 2 Not Estab. %   Basos 1 Not Estab. %   Neutrophils Absolute 5.1 1.4 - 7.0 x10E3/uL   Lymphocytes Absolute 3.0 0.7 - 3.1 x10E3/uL   Monocytes Absolute 0.7 0.1 - 0.9 x10E3/uL   EOS (ABSOLUTE) 0.2 0.0 - 0.4 x10E3/uL   Basophils Absolute 0.1 0.0 - 0.2 x10E3/uL   Immature Granulocytes 0 Not Estab. %   Immature Grans (Abs) 0.0 0.0 - 0.1 x10E3/uL  Comprehensive metabolic panel  Result Value Ref Range   Glucose 135 (H) 65 - 99 mg/dL   BUN 18 6 - 24 mg/dL   Creatinine, Ser 1.04 0.76 - 1.27 mg/dL   GFR calc non Af Amer 80 >59 mL/min/1.73   GFR calc Af Amer 92 >59 mL/min/1.73   BUN/Creatinine Ratio 17 9 - 20   Sodium 135 134 - 144 mmol/L   Potassium 4.1 3.5 - 5.2 mmol/L   Chloride 95 (L) 96 - 106 mmol/L   CO2 20 20 - 29 mmol/L   Calcium 9.7 8.7 - 10.2 mg/dL   Total Protein 7.7 6.0 - 8.5 g/dL   Albumin 4.9 3.8 - 4.9 g/dL   Globulin, Total 2.8 1.5 - 4.5 g/dL   Albumin/Globulin Ratio 1.8 1.2 - 2.2   Bilirubin Total 0.4 0.0 - 1.2 mg/dL   Alkaline Phosphatase 66 39 - 117 IU/L   AST 27 0 - 40 IU/L   ALT 30 0 - 44 IU/L  Lipid Panel w/o Chol/HDL Ratio  Result Value Ref Range   Cholesterol, Total 155 100 - 199 mg/dL   Triglycerides 131 0 - 149 mg/dL   HDL 37 (L) >39 mg/dL   VLDL Cholesterol Cal 26 5 - 40 mg/dL   LDL Calculated 92 0 - 99 mg/dL  PSA  Result Value Ref Range   Prostate Specific Ag, Serum 1.0 0.0 - 4.0 ng/mL  TSH  Result Value Ref Range   TSH 1.840 0.450 - 4.500 uIU/mL  UA/M w/rflx Culture, Routine  Result Value Ref Range   Specific Gravity, UA 1.010 1.005 - 1.030   pH, UA 7.0 5.0 - 7.5   Color, UA Yellow Yellow    Appearance Ur Clear Clear   Leukocytes, UA Trace (A) Negative   Protein, UA Negative Negative/Trace   Glucose, UA Negative Negative   Ketones,  UA Negative Negative   RBC, UA Negative Negative   Bilirubin, UA Negative Negative   Urobilinogen, Ur 0.2 0.2 - 1.0 mg/dL   Nitrite, UA Negative Negative   Microscopic Examination See below:    Urinalysis Reflex Comment       Assessment & Plan:   Problem List Items Addressed This Visit      Cardiovascular and Mediastinum   HTN (hypertension) - Primary    Under good control on current regimen. Continue current regimen. Continue to monitor. Call with any concerns. Refills given. Will get labs drawn.        Relevant Medications   lisinopril-hydrochlorothiazide (ZESTORETIC) 20-25 MG tablet   Other Relevant Orders   Basic metabolic panel    Other Visit Diagnoses    Insomnia, unspecified type       Offered trazodone- he declined it. Advised increased exercise. Call with any concerns.        Follow up plan: Return in about 6 months (around 07/31/2019).   . This visit was completed via [employer] due to the restrictions of the COVID-19 pandemic. All issues as above were discussed and addressed. Physical exam was done as above through visual confirmation on D[employer]imity. If it was felt that the patient should be evaluated in the office, they were directed there. The patient verbally consented to this visit. . Location of the patient: home . Location of the provider: home . Those involved with this call:  . Provider: Park Liter, DO . CMA: Yvonna Alanis, [place] . Front Desk/Registration: Don Perking  . Time spent on call: 25 minutes with patient face to face via video conference. More than 50% of this time was spent in counseling and coordination of care. 40 minutes total spent in review of patient's record and preparation of their chart.

## 2019-01-28 NOTE — Assessment & Plan Note (Signed)
Under good control on current regimen. Continue current regimen. Continue to monitor. Call with any concerns. Refills given. Will get labs drawn.

## 2019-02-18 ENCOUNTER — Telehealth: Payer: Self-pay | Admitting: *Deleted

## 2019-02-18 ENCOUNTER — Telehealth: Payer: Self-pay

## 2019-02-18 DIAGNOSIS — Z122 Encounter for screening for malignant neoplasm of respiratory organs: Secondary | ICD-10-CM

## 2019-02-18 DIAGNOSIS — Z87891 Personal history of nicotine dependence: Secondary | ICD-10-CM

## 2019-02-18 NOTE — Telephone Encounter (Signed)
Patient has been notified that annual lung cancer screening low dose CT scan is due currently or will be in near future. Confirmed that patient is within the age range of 55-77, and asymptomatic, (no signs or symptoms of lung cancer). Patient denies illness that would prevent curative treatment for lung cancer if found. Verified smoking history, (current, 36.75 pack year). The shared decision making visit was done 01/21/18. Patient is agreeable for CT scan being scheduled.

## 2019-02-18 NOTE — Telephone Encounter (Signed)
Call pt regarding lung screening. Pt is a current smoker, smoking 1/2 to 1 pack per day. Pt denies any new health issues. Scan is scheduled for June 16 th at 3:30

## 2019-02-21 ENCOUNTER — Other Ambulatory Visit: Payer: Self-pay

## 2019-02-21 ENCOUNTER — Other Ambulatory Visit: Payer: Medicaid Other

## 2019-02-21 DIAGNOSIS — I10 Essential (primary) hypertension: Secondary | ICD-10-CM | POA: Diagnosis not present

## 2019-02-22 ENCOUNTER — Other Ambulatory Visit: Payer: Self-pay

## 2019-02-22 ENCOUNTER — Encounter: Payer: Self-pay | Admitting: Family Medicine

## 2019-02-22 ENCOUNTER — Ambulatory Visit
Admission: RE | Admit: 2019-02-22 | Discharge: 2019-02-22 | Disposition: A | Discharge status: Home / Self Care | Payer: Medicaid Other | Source: Ambulatory Visit | Attending: Oncology | Admitting: Oncology

## 2019-02-22 DIAGNOSIS — Z122 Encounter for screening for malignant neoplasm of respiratory organs: Secondary | ICD-10-CM | POA: Insufficient documentation

## 2019-02-22 DIAGNOSIS — Z87891 Personal history of nicotine dependence: Secondary | ICD-10-CM | POA: Insufficient documentation

## 2019-02-22 LAB — BASIC METABOLIC PANEL
BUN/Creatinine Ratio: 19 (ref 9–20)
BUN: 18 mg/dL (ref 6–24)
CO2: 21 mmol/L (ref 20–29)
Calcium: 9 mg/dL (ref 8.7–10.2)
Chloride: 101 mmol/L (ref 96–106)
Creatinine, Ser: 0.95 mg/dL (ref 0.76–1.27)
GFR calc Af Amer: 103 mL/min/{1.73_m2} (ref >59)
GFR calc non Af Amer: 89 mL/min/{1.73_m2} (ref >59)
Glucose: 124 mg/dL — ABNORMAL HIGH (ref 65–99)
Potassium: 4 mmol/L (ref 3.5–5.2)
Sodium: 136 mmol/L (ref 134–144)

## 2019-02-24 ENCOUNTER — Encounter: Payer: Self-pay | Admitting: *Deleted

## 2019-03-07 IMAGING — CT CT CHEST LUNG CANCER SCREENING LOW DOSE W/O CM
2 of 5 series · 15 of 40 positions shown, 18 images · non-contrast
Comparison: None.

CLINICAL DATA: 56-year-old male current smoker with 36 pack year
history of smoking. Lung cancer screening examination.

EXAM:
CT CHEST WITHOUT CONTRAST LOW-DOSE FOR LUNG CANCER SCREENING
TECHNIQUE: Multidetector CT imaging of the chest was performed following the
standard protocol without IV contrast.

[Series 3: lung · axial · 0.73mm/px · z∈[-1376,-1072]mm · 12 of 337 slices shown, 15 images (1 of 2)]
[im 17/337  mediastinal]
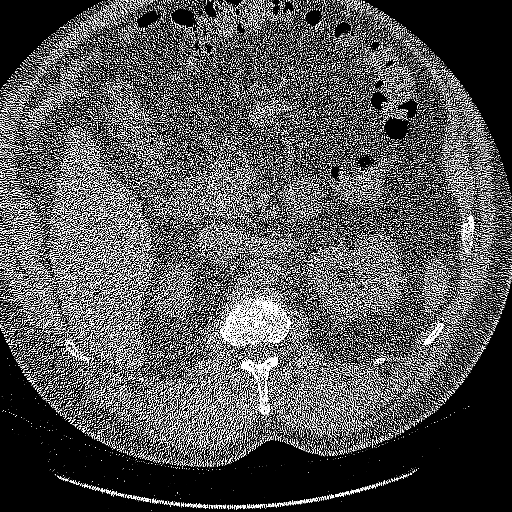
[im 17/337  lung]
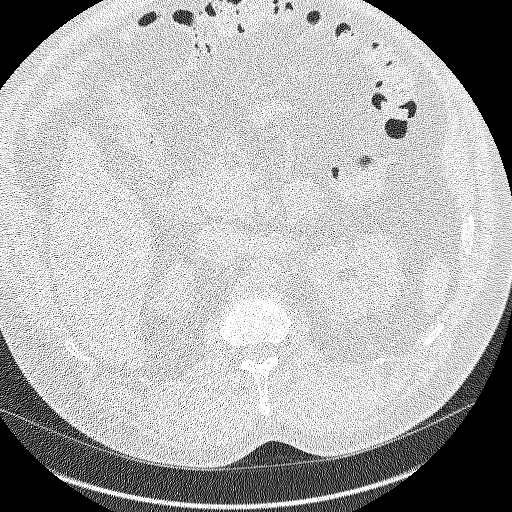
[im 49/337  lung]
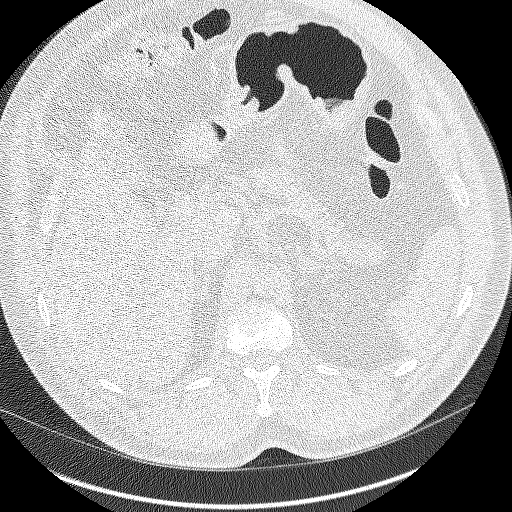
[im 81/337  lung]
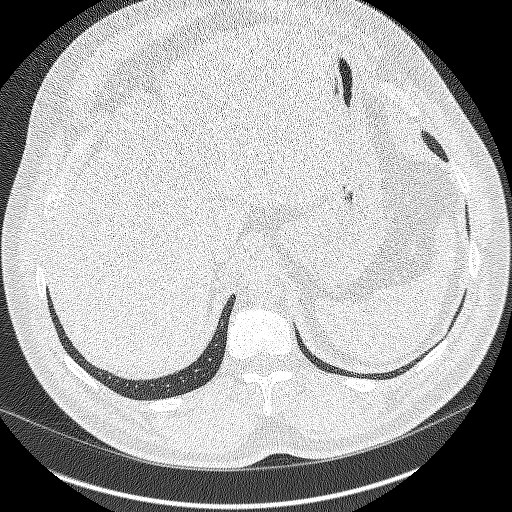
[im 97/337  lung]
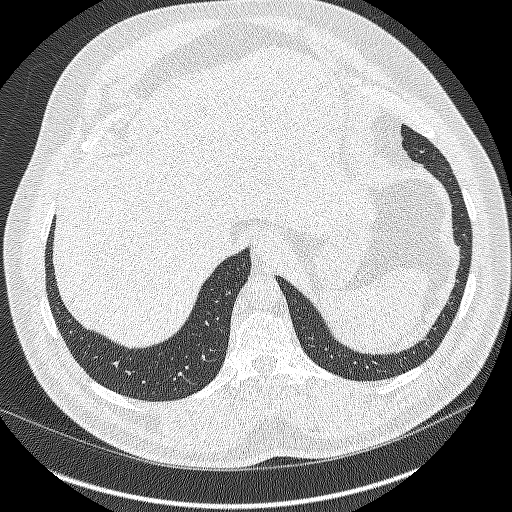
[im 129/337  mediastinal]
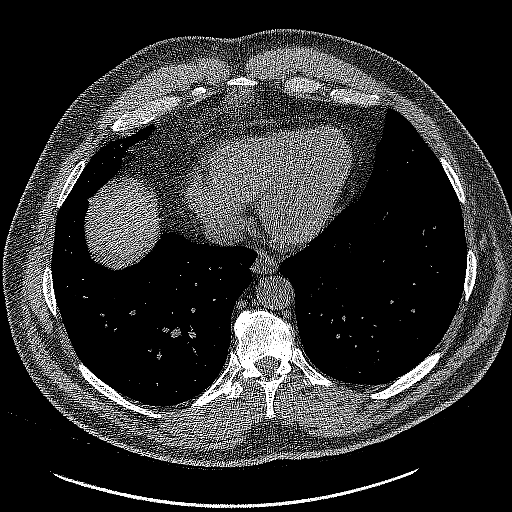
[im 129/337  lung]
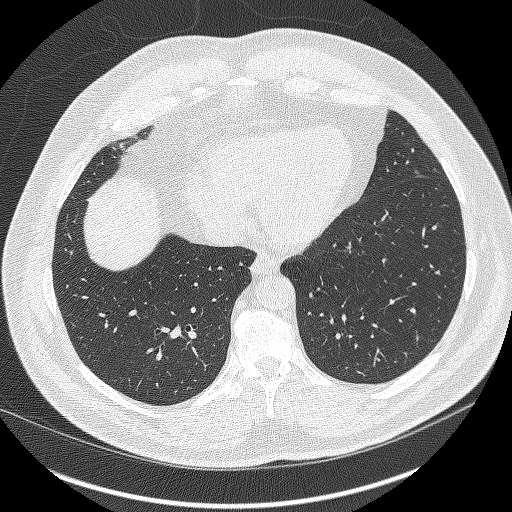
[im 161/337  lung]
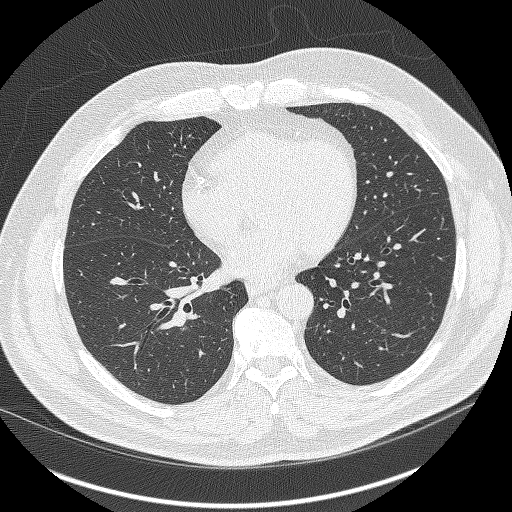
[im 177/337  lung]
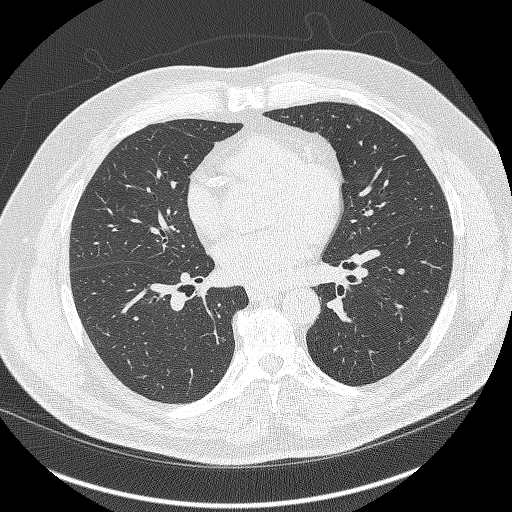
[im 209/337  lung]
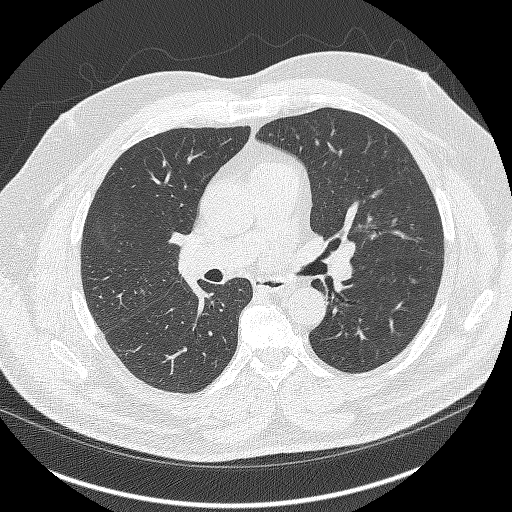
[im 241/337  mediastinal]
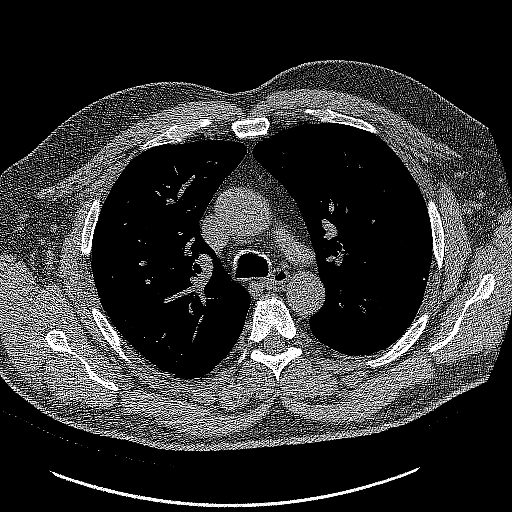
[im 241/337  lung]
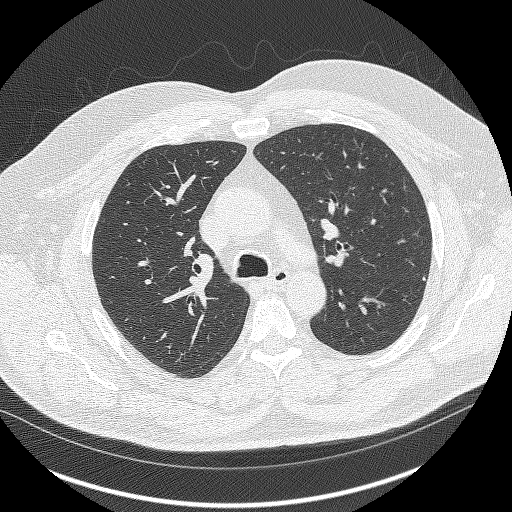
[im 257/337  lung]
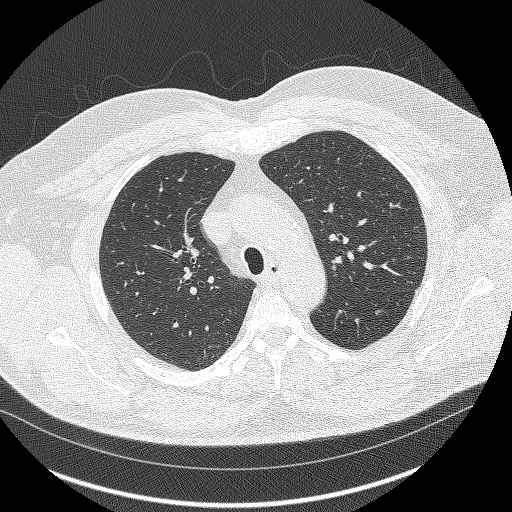
[im 289/337  lung]
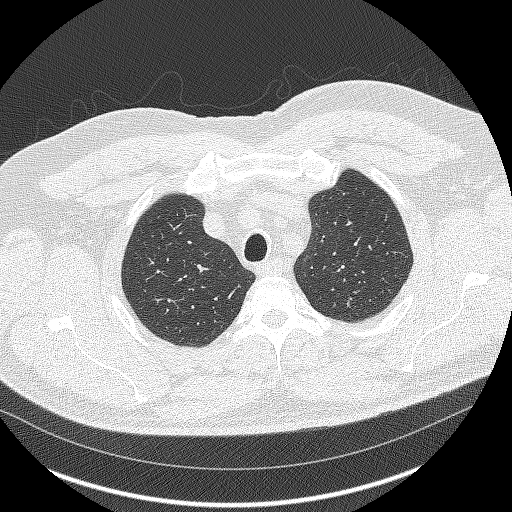
[im 321/337  lung]
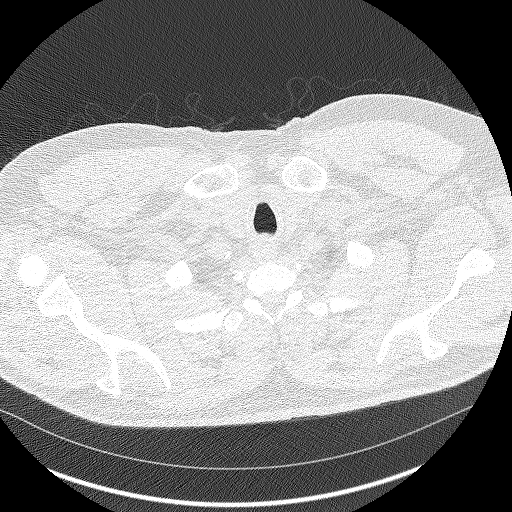

[Series 4: lung · coronal · 0.66mm/px · 3 of 323 slices shown (2 of 2)]
[im 65/323  lung]
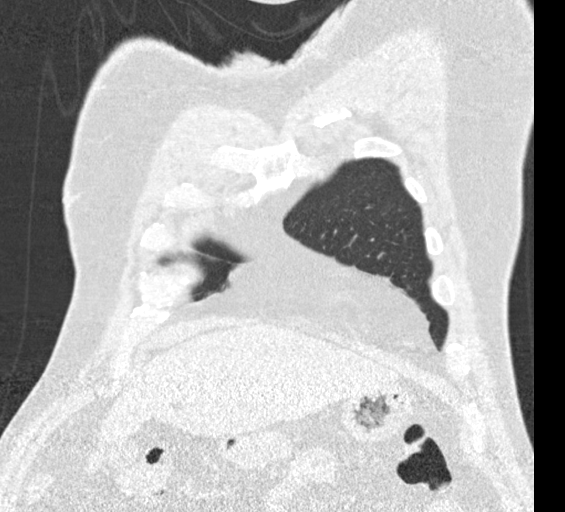
[im 129/323  lung]
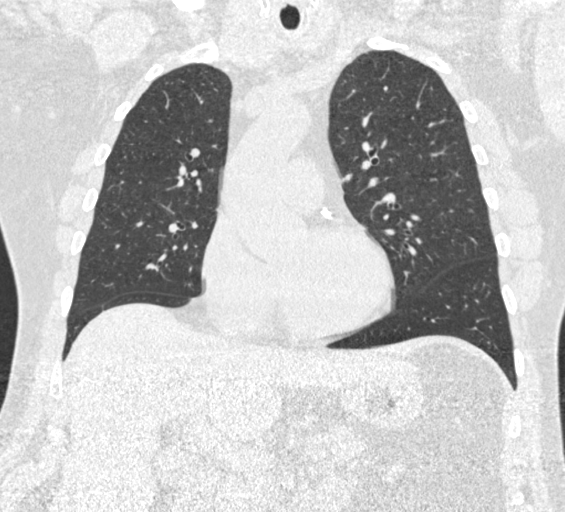
[im 194/323  lung]
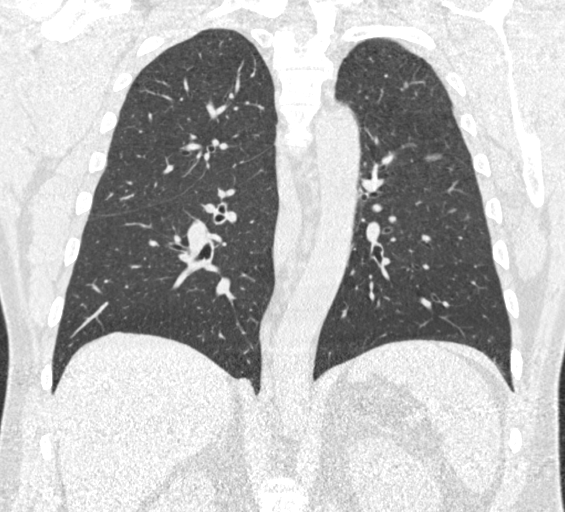

[15 of 40 positions shown; findings below may reference images not displayed]

FINDINGS: Cardiovascular: Heart size is normal. There is no significant
pericardial fluid, thickening or pericardial calcification. There is
aortic atherosclerosis, as well as atherosclerosis of the great
vessels of the mediastinum and the coronary arteries, including
calcified atherosclerotic plaque in the left anterior descending and
right coronary arteries.

Mediastinum/Nodes: No pathologically enlarged mediastinal or hilar
lymph nodes. Please note that accurate exclusion of hilar adenopathy
is limited on noncontrast CT scans. Esophagus is unremarkable in
appearance. No axillary lymphadenopathy.

Lungs/Pleura: Several small pulmonary nodules are noted in both
lungs, largest of which is in the posterior aspect of the right
middle lobe abutting the major fissure (axial image 150 of series
3), with a volume derived mean diameter of 3.4 mm. No larger more
suspicious appearing pulmonary nodules or masses are noted. No acute
consolidative airspace disease. No pleural effusions.

Upper Abdomen: Aortic atherosclerosis.

Musculoskeletal: There are no aggressive appearing lytic or blastic
lesions noted in the visualized portions of the skeleton.
IMPRESSION: 1. Lung-RADS 2S, benign appearance or behavior. Continue annual
screening with low-dose chest CT without contrast in 12 months.
2. The "S" modifier above refers to potentially clinically
significant non lung cancer related findings. Specifically, there is
aortic atherosclerosis, in addition to 2 vessel coronary artery
disease. Please note that although the presence of coronary artery
calcium documents the presence of coronary artery disease, the
severity of this disease and any potential stenosis cannot be
assessed on this non-gated CT examination. Assessment for potential
risk factor modification, dietary therapy or pharmacologic therapy
may be warranted, if clinically indicated.

Aortic Atherosclerosis ([id number]).

## 2019-03-25 ENCOUNTER — Encounter: Payer: Self-pay | Admitting: Emergency Medicine

## 2019-03-25 ENCOUNTER — Other Ambulatory Visit: Payer: Self-pay

## 2019-03-25 ENCOUNTER — Ambulatory Visit
Admission: EM | Admit: 2019-03-25 | Discharge: 2019-03-25 | Disposition: A | Discharge status: Home / Self Care | Payer: Medicaid Other | Attending: Emergency Medicine | Admitting: Emergency Medicine

## 2019-03-25 DIAGNOSIS — S61211A Laceration without foreign body of left index finger without damage to nail, initial encounter: Secondary | ICD-10-CM | POA: Diagnosis not present

## 2019-03-25 DIAGNOSIS — W260XXA Contact with knife, initial encounter: Secondary | ICD-10-CM
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
## 2019-03-25 NOTE — Discharge Instructions (Addendum)
Let the steri-strips fall off. Keep wound dry. Leave open to air while at home and covered while at work.

## 2019-03-25 NOTE — ED Triage Notes (Signed)
Patient states that he cut his left 2nd finger on a knife around 9:30pm last night.  Patient has minimal bleeding at this time.

## 2019-08-11 ENCOUNTER — Other Ambulatory Visit: Payer: Self-pay | Admitting: Family Medicine

## 2019-08-11 NOTE — Telephone Encounter (Signed)
Requested medication (s) are due for refill today: yes  Requested medication (s) are on the active medication list: yes  Last refill:  05/06/2019  Future visit scheduled: yes  Notes to clinic:review for refill Patient has appointment in March   Requested Prescriptions  Pending Prescriptions Disp Refills   lisinopril-hydrochlorothiazide (ZESTORETIC) 20-25 MG tablet [Pharmacy Med Name: LISINOPRIL-HCTZ 20-25 MG TAB] 90 tablet 1    Sig: TAKE 1 TABLET BY MOUTH EVERY DAY     Cardiovascular:  ACEI + Diuretic Combos Failed - 08/11/2019 12:48 PM      Failed - Last BP in normal range    BP Readings from Last 1 Encounters:  03/25/19 (!) 148/87         Failed - Valid encounter within last 6 months    Recent Outpatient Visits          6 months ago Essential hypertension   [place], Megan P, DO   9 months ago Routine general medical examination at a health care facility   [place], [place], DO   1 year ago Essential hypertension   [place], [place], DO   1 year ago Essential hypertension   [place], Megan P, DO   1 year ago Acute right-sided low back pain without sciatica   [place], Megan P, DO      Future Appointments            In 3 months Johnson, Megan P, DO [place]           Passed - Na in normal range and within 180 days    Sodium  Date Value Ref Range Status  02/21/2019 136 134 - 144 mmol/L Final         Passed - K in normal range and within 180 days    Potassium  Date Value Ref Range Status  02/21/2019 4.0 3.5 - 5.2 mmol/L Final         Passed - Cr in normal range and within 180 days    Creatinine, Ser  Date Value Ref Range Status  02/21/2019 0.95 0.76 - 1.27 mg/dL Final         Passed - Ca in normal range and within 180 days    Calcium  Date Value Ref Range Status  02/21/2019 9.0 8.7 - 10.2 mg/dL Final        Passed - Patient is not pregnant

## 2019-08-11 NOTE — Telephone Encounter (Signed)
Needs appointment before March- overdue now.

## 2019-08-11 NOTE — Telephone Encounter (Signed)
Routing to provider  

## 2019-08-11 NOTE — Telephone Encounter (Signed)
Called pt scheduled 08/19/2019

## 2019-08-19 ENCOUNTER — Ambulatory Visit (INDEPENDENT_AMBULATORY_CARE_PROVIDER_SITE_OTHER): Payer: Medicaid Other | Admitting: Family Medicine

## 2019-08-19 ENCOUNTER — Encounter: Payer: Self-pay | Admitting: Family Medicine

## 2019-08-19 ENCOUNTER — Other Ambulatory Visit: Payer: Self-pay

## 2019-08-19 DIAGNOSIS — I10 Essential (primary) hypertension: Secondary | ICD-10-CM

## 2019-08-19 NOTE — Assessment & Plan Note (Addendum)
Not under good control. Did not take his medicine today. Will get him back in in 1 month for physical and recheck on meds. Refills given earlier this month. Call with any concerns. Continue to monitor. Will check labs in 1 month.

## 2019-08-19 NOTE — Progress Notes (Signed)
BP (!) 152/89   Pulse 100    Subjective:    Patient ID: Jimmy Simmons, male    DOB: 09-12-1960, 58 y.o.   MRN: [id number]  HPI: Jimmy Simmons is a 58 y.o. male  Chief Complaint  Patient presents with  . Hypertension   HYPERTENSION- did not take his BP medicine today Hypertension status: stable  Satisfied with current treatment? yes Duration of hypertension: chronic BP monitoring frequency:  a few times a month BP medication side effects:  no Medication compliance: good compliance Previous BP meds:lisinopril-HCTZ Aspirin: no Recurrent headaches: no Visual changes: no Palpitations: no Dyspnea: no Chest pain: no Lower extremity edema: no Dizzy/lightheaded: no   Relevant past medical, surgical, family and social history reviewed and updated as indicated. Interim medical history since our last visit reviewed. Allergies and medications reviewed and updated.  Review of Systems  Constitutional: Negative.   Respiratory: Negative.   Cardiovascular: Negative.   Musculoskeletal: Negative.   Skin: Negative.   Neurological: Negative.   Psychiatric/Behavioral: Negative.     Per HPI unless specifically indicated above     Objective:    BP (!) 152/89   Pulse 100   Wt Readings from Last 3 Encounters:  03/25/19 250 lb (113.4 kg)  02/22/19 252 lb (114.3 kg)  11/12/18 252 lb (114.3 kg)    Physical Exam Vitals and nursing note reviewed.  Pulmonary:     Effort: Pulmonary effort is normal. No respiratory distress.     Comments: Speaking in full sentences Neurological:     Mental Status: He is alert.  Psychiatric:        Mood and Affect: Mood normal.        Behavior: Behavior normal.        Thought Content: Thought content normal.        Judgment: Judgment normal.     Results for orders placed or performed in visit on [id number]  Basic metabolic panel  Result Value Ref Range   Glucose 124 (H) 65 - 99 mg/dL   BUN 18 6 - 24 mg/dL   Creatinine, Ser 0.95 0.76 - 1.27 mg/dL   GFR calc non Af Amer 89 >59 mL/min/1.73   GFR calc Af Amer 103 >59 mL/min/1.73   BUN/Creatinine Ratio 19 9 - 20   Sodium 136 134 - 144 mmol/L   Potassium 4.0 3.5 - 5.2 mmol/L   Chloride 101 96 - 106 mmol/L   CO2 21 20 - 29 mmol/L   Calcium 9.0 8.7 - 10.2 mg/dL      Assessment & Plan:   Problem List Items Addressed This Visit      Cardiovascular and Mediastinum   HTN (hypertension)    Not under good control. Did not take his medicine today. Will get him back in in 1 month for physical and recheck on meds. Refills given earlier this month. Call with any concerns. Continue to monitor. Will check labs in 1 month.           Follow up plan: Return in about 4 weeks (around 09/16/2019) for Physical.   . This visit was completed via telephone due to the restrictions of the COVID-19 pandemic. All issues as above were discussed and addressed but no physical exam was performed. If it was felt that the patient should be evaluated in the office, they were directed there. The patient verbally consented to this visit. Patient was unable to complete an audio/visual visit due to Lack of equipment. Due to the catastrophic nature  of the COVID-19 pandemic, this visit was done through audio contact only. . Location of the patient: home . Location of the provider: work . Those involved with this call:  . Provider: Park Liter, DO . CMA: Tiffany Reel, CMA . Front Desk/Registration: Don Perking  . Time spent on call: 21 minutes on the phone discussing health concerns. 23 minutes total spent in review of patient's record and preparation of their chart.

## 2019-10-14 ENCOUNTER — Encounter: Payer: Self-pay | Admitting: Family Medicine

## 2019-10-14 ENCOUNTER — Other Ambulatory Visit: Payer: Self-pay

## 2019-10-14 ENCOUNTER — Ambulatory Visit (INDEPENDENT_AMBULATORY_CARE_PROVIDER_SITE_OTHER): Payer: Medicaid Other | Admitting: Family Medicine

## 2019-10-14 VITALS — BP 102/64 | HR 78 | Temp 98.6°F | Ht 67.91 in | Wt 250.8 lb

## 2019-10-14 DIAGNOSIS — B182 Chronic viral hepatitis C: Secondary | ICD-10-CM | POA: Diagnosis not present

## 2019-10-14 DIAGNOSIS — Z0000 Encounter for general adult medical examination without abnormal findings: Secondary | ICD-10-CM

## 2019-10-14 DIAGNOSIS — I10 Essential (primary) hypertension: Secondary | ICD-10-CM | POA: Diagnosis not present
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
## 2019-10-15 LAB — COMPREHENSIVE METABOLIC PANEL
ALT: 29 IU/L (ref 0–44)
AST: 23 IU/L (ref 0–40)
Albumin/Globulin Ratio: 2 (ref 1.2–2.2)
Albumin: 4.8 g/dL (ref 3.8–4.9)
Alkaline Phosphatase: 70 IU/L (ref 39–117)
BUN/Creatinine Ratio: 15 (ref 9–20)
BUN: 15 mg/dL (ref 6–24)
Bilirubin Total: <0.2 mg/dL (ref 0.0–1.2)
CO2: 19 mmol/L — ABNORMAL LOW (ref 20–29)
Calcium: 10.1 mg/dL (ref 8.7–10.2)
Chloride: 101 mmol/L (ref 96–106)
Creatinine, Ser: 1.02 mg/dL (ref 0.76–1.27)
GFR calc Af Amer: 94 mL/min/{1.73_m2} (ref >59)
GFR calc non Af Amer: 81 mL/min/{1.73_m2} (ref >59)
Globulin, Total: 2.4 g/dL (ref 1.5–4.5)
Glucose: 115 mg/dL — ABNORMAL HIGH (ref 65–99)
Potassium: 4 mmol/L (ref 3.5–5.2)
Sodium: 139 mmol/L (ref 134–144)
Total Protein: 7.2 g/dL (ref 6.0–8.5)

## 2019-10-15 LAB — LIPID PANEL W/O CHOL/HDL RATIO
Cholesterol, Total: 165 mg/dL (ref 100–199)
HDL: 44 mg/dL (ref >39)
LDL Chol Calc (NIH): 82 mg/dL (ref 0–99)
Triglycerides: 233 mg/dL — ABNORMAL HIGH (ref 0–149)
VLDL Cholesterol Cal: 39 mg/dL (ref 5–40)

## 2019-10-16 LAB — UA/M W/RFLX CULTURE, ROUTINE
Bilirubin, UA: NEGATIVE
Glucose, UA: NEGATIVE
Ketones, UA: NEGATIVE
Nitrite, UA: NEGATIVE
Protein,UA: NEGATIVE
Specific Gravity, UA: 1.01 (ref 1.005–1.030)
Urobilinogen, Ur: 0.2 mg/dL (ref 0.2–1.0)
pH, UA: 5.5 (ref 5.0–7.5)

## 2019-10-16 LAB — MICROALBUMIN, URINE WAIVED
Creatinine, Urine Waived: 10 mg/dL (ref 10–300)
Microalb, Ur Waived: 10 mg/L (ref 0–19)
Microalb/Creat Ratio: <30 mg/g (ref <30)

## 2019-10-16 LAB — URINE CULTURE, REFLEX: Organism ID, Bacteria: NO GROWTH

## 2019-10-16 LAB — BAYER DCA HB A1C WAIVED: HB A1C (BAYER DCA - WAIVED): 6.4 % (ref <7.0)

## 2019-10-16 LAB — MICROSCOPIC EXAMINATION

## 2019-10-17 ENCOUNTER — Encounter: Payer: Self-pay | Admitting: Family Medicine

## 2019-11-14 ENCOUNTER — Encounter: Payer: Medicaid Other | Admitting: Family Medicine

## 2020-01-04 ENCOUNTER — Telehealth: Payer: Self-pay | Admitting: Family Medicine

## 2020-01-04 NOTE — Telephone Encounter (Signed)
We have not discussed his hep c in a while. Please find out what's going on- if he'd like to go back to his previous hepatologist or if he needs an appointment. Also he does not have his hep C labs resulted in the computer- can we please have [facility] check on that?

## 2020-01-04 NOTE — Telephone Encounter (Signed)
Pt would like a referral generated to where they had previously discuss about hep c. Please advise.

## 2020-01-04 NOTE — Telephone Encounter (Signed)
Patient requesting to speak with Dr. [name]. He states he is having some issues with his liver, including pain with coughing. Patient declined appointment stating he would like Dr. [name] to call him first.

## 2020-01-04 NOTE — Telephone Encounter (Signed)
Appt scheduled

## 2020-01-05 ENCOUNTER — Other Ambulatory Visit: Payer: Self-pay

## 2020-01-05 ENCOUNTER — Ambulatory Visit (INDEPENDENT_AMBULATORY_CARE_PROVIDER_SITE_OTHER): Payer: Medicaid Other | Admitting: Family Medicine

## 2020-01-05 ENCOUNTER — Encounter: Payer: Self-pay | Admitting: Family Medicine

## 2020-01-05 VITALS — BP 133/68 | HR 87 | Temp 98.4°F | Wt 258.0 lb

## 2020-01-05 DIAGNOSIS — B182 Chronic viral hepatitis C: Secondary | ICD-10-CM | POA: Diagnosis not present

## 2020-01-05 DIAGNOSIS — B181 Chronic viral hepatitis B without delta-agent: Secondary | ICD-10-CM

## 2020-01-05 DIAGNOSIS — R1011 Right upper quadrant pain: Secondary | ICD-10-CM

## 2020-01-05 DIAGNOSIS — S39011A Strain of muscle, fascia and tendon of abdomen, initial encounter: Secondary | ICD-10-CM | POA: Diagnosis not present

## 2020-01-05 MED ORDER — CYCLOBENZAPRINE HCL 10 MG PO TABS: 10.0000 mg | ORAL_TABLET | Freq: Every day | ORAL | 1 refills | Status: DC

## 2020-01-05 MED ORDER — NAPROXEN 500 MG PO TABS: 500.0000 mg | ORAL_TABLET | Freq: Two times a day (BID) | ORAL | 1 refills | Status: DC

## 2020-01-05 NOTE — Assessment & Plan Note (Signed)
S/p treatment. Checking labs today. Unlikely cause of pain. Await results. Call with any concerns.

## 2020-01-05 NOTE — Progress Notes (Signed)
BP 133/68 (BP Location: Left Arm, Patient Position: Sitting, Cuff Size: Normal)   Pulse 87   Temp 98.4 F (36.9 C) (Oral)   Wt 258 lb (117 kg)   SpO2 94%   BMI 39.33 kg/m    Subjective:    Patient ID: Jimmy Simmons, male    DOB: [birth year], 59 y.o.   MRN: [id number]  HPI: Jimmy Simmons is a 59 y.o. male  Chief Complaint  Patient presents with  . Abdominal Pain   ABDOMINAL PAIN  Duration: worse in the last 3 days, worse with coughing, lifting and twisting Onset: sudden after lifting heavy boxes Severity: moderate with cough Quality: aching and sore Location:  R side in the middle   Episode duration: couple of seconds Radiation: no Frequency: intermittent Aggravating factors: coughing Status: fluctuating Treatments attempted: rest Fever: no Nausea: no Vomiting: no Weight loss: no Decreased appetite: no Diarrhea: no Constipation: no Blood in stool: no Heartburn: no Jaundice: no Rash: no Dysuria/urinary frequency: no Hematuria: no History of sexually transmitted disease: no Recurrent NSAID use: no   Relevant past medical, surgical, family and social history reviewed and updated as indicated. Interim medical history since our last visit reviewed. Allergies and medications reviewed and updated.  Review of Systems  Constitutional: Negative.   Respiratory: Negative.   Cardiovascular: Negative.   Gastrointestinal: Negative for abdominal distention, abdominal pain, anal bleeding, blood in stool, constipation, diarrhea, nausea, rectal pain and vomiting.  Musculoskeletal: Negative.   Skin: Negative.   Psychiatric/Behavioral: Negative.     Per HPI unless specifically indicated above     Objective:    BP 133/68 (BP Location: Left Arm, Patient Position: Sitting, Cuff Size: Normal)   Pulse 87   Temp 98.4 F (36.9 C) (Oral)   Wt 258 lb (117 kg)   SpO2 94%   BMI 39.33 kg/m   Wt Readings from Last 3 Encounters:  01/05/20 258 lb (117 kg)  10/14/19 250 lb 12.8 oz  (113.8 kg)  03/25/19 250 lb (113.4 kg)    Physical Exam Vitals and nursing note reviewed.  Constitutional:      General: He is not in acute distress.    Appearance: Normal appearance. He is obese. He is not ill-appearing, toxic-appearing or diaphoretic.  HENT:     Head: Normocephalic and atraumatic.     Right Ear: External ear normal.     Left Ear: External ear normal.     Nose: Nose normal.     Mouth/Throat:     Mouth: Mucous membranes are moist.     Pharynx: Oropharynx is clear.  Eyes:     General: No scleral icterus.       Right eye: No discharge.        Left eye: No discharge.     Extraocular Movements: Extraocular movements intact.     Conjunctiva/sclera: Conjunctivae normal.     Pupils: Pupils are equal, round, and reactive to light.  Cardiovascular:     Rate and Rhythm: Normal rate and regular rhythm.     Pulses: Normal pulses.     Heart sounds: Normal heart sounds. No murmur. No friction rub. No gallop.   Pulmonary:     Effort: Pulmonary effort is normal. No respiratory distress.     Breath sounds: Normal breath sounds. No stridor. No wheezing, rhonchi or rales.  Chest:     Chest wall: No tenderness.  Abdominal:     General: Abdomen is protuberant. Bowel sounds are normal.     Palpations:  Abdomen is soft.    Musculoskeletal:        General: Normal range of motion.     Cervical back: Normal range of motion and neck supple.  Skin:    General: Skin is warm and dry.     Capillary Refill: Capillary refill takes less than 2 seconds.     Coloration: Skin is not jaundiced or pale.     Findings: No bruising, erythema, lesion or rash.  Neurological:     General: No focal deficit present.     Mental Status: He is alert and oriented to person, place, and time. Mental status is at baseline.  Psychiatric:        Mood and Affect: Mood normal.        Behavior: Behavior normal.        Thought Content: Thought content normal.        Judgment: Judgment normal.     Results  for orders placed or performed in visit on 10/14/19  Microscopic Examination   BLD  Result Value Ref Range   WBC, UA 6-10 (A) 0 - 5 /hpf   RBC 3-10 (A) 0 - 2 /hpf   Epithelial Cells (non renal) 0-10 0 - 10 /hpf   Bacteria, UA Few (A) None seen/Few  Urine Culture, Reflex   BLD  Result Value Ref Range   Urine Culture, Routine Final report    Organism ID, Bacteria No growth   Bayer DCA Hb A1c Waived  Result Value Ref Range   HB A1C (BAYER DCA - WAIVED) 6.4 <7.0 %  Comprehensive metabolic panel  Result Value Ref Range   Glucose 115 (H) 65 - 99 mg/dL   BUN 15 6 - 24 mg/dL   Creatinine, Ser 1.02 0.76 - 1.27 mg/dL   GFR calc non Af Amer 81 >59 mL/min/1.73   GFR calc Af Amer 94 >59 mL/min/1.73   BUN/Creatinine Ratio 15 9 - 20   Sodium 139 134 - 144 mmol/L   Potassium 4.0 3.5 - 5.2 mmol/L   Chloride 101 96 - 106 mmol/L   CO2 19 (L) 20 - 29 mmol/L   Calcium 10.1 8.7 - 10.2 mg/dL   Total Protein 7.2 6.0 - 8.5 g/dL   Albumin 4.8 3.8 - 4.9 g/dL   Globulin, Total 2.4 1.5 - 4.5 g/dL   Albumin/Globulin Ratio 2.0 1.2 - 2.2   Bilirubin Total <0.2 0.0 - 1.2 mg/dL   Alkaline Phosphatase 70 39 - 117 IU/L   AST 23 0 - 40 IU/L   ALT 29 0 - 44 IU/L  Lipid Panel w/o Chol/HDL Ratio  Result Value Ref Range   Cholesterol, Total 165 100 - 199 mg/dL   Triglycerides 233 (H) 0 - 149 mg/dL   HDL 44 >39 mg/dL   VLDL Cholesterol Cal 39 5 - 40 mg/dL   LDL Chol Calc (NIH) 82 0 - 99 mg/dL  Microalbumin, Urine Waived  Result Value Ref Range   Microalb, Ur Waived 10 0 - 19 mg/L   Creatinine, Urine Waived 10 10 - 300 mg/dL   Microalb/Creat Ratio <30 <30 mg/g  UA/M w/rflx Culture, Routine   Specimen: Blood   BLD  Result Value Ref Range   Specific Gravity, UA 1.010 1.005 - 1.030   pH, UA 5.5 5.0 - 7.5   Color, UA Yellow Yellow   Appearance Ur Clear Clear   Leukocytes,UA 2+ (A) Negative   Protein,UA Negative Negative/Trace   Glucose, UA Negative Negative   Ketones, UA  Negative Negative   RBC, UA  Trace (A) Negative   Bilirubin, UA Negative Negative   Urobilinogen, Ur 0.2 0.2 - 1.0 mg/dL   Nitrite, UA Negative Negative   Microscopic Examination See below:    Urinalysis Reflex Comment       Assessment & Plan:   Problem List Items Addressed This Visit      Digestive   Hepatitis C    S/p treatment. Checking labs today. Unlikely cause of pain. Await results. Call with any concerns.       Relevant Orders   HCV RNA quant   Hepatitis B    Checking labs today. Unlikely cause of pain. Await results. Call with any concerns.       Relevant Orders   Acute Hep Panel & Hep B Surface Ab    Other Visit Diagnoses    RUQ pain    -  Primary   More R sided pain. Will treat muscle spasm and check labs. Await results. Call if not getting better or getting worse.    Relevant Orders   Comprehensive metabolic panel   CBC with Differential/Platelet   Strain of abdominal muscle, initial encounter       Will treat with flexeril and naproxen and stretches. Call with any concerns or if not getting better.        Follow up plan: Return if symptoms worsen or fail to improve.

## 2020-01-05 NOTE — Assessment & Plan Note (Signed)
Checking labs today. Unlikely cause of pain. Await results. Call with any concerns.

## 2020-01-07 LAB — ACUTE HEP PANEL AND HEP B SURFACE AB
Hep A IgM: NEGATIVE
Hep B C IgM: NEGATIVE
Hep C Virus Ab: >11 s/co ratio — ABNORMAL HIGH (ref 0.0–0.9)
Hepatitis B Surf Ab Quant: 7.9 m[IU]/mL — ABNORMAL LOW (ref >9.9)
Hepatitis B Surface Ag: NEGATIVE

## 2020-01-07 LAB — COMPREHENSIVE METABOLIC PANEL
ALT: 23 IU/L (ref 0–44)
AST: 20 IU/L (ref 0–40)
Albumin/Globulin Ratio: 1.9 (ref 1.2–2.2)
Albumin: 4.5 g/dL (ref 3.8–4.9)
Alkaline Phosphatase: 60 IU/L (ref 39–117)
BUN/Creatinine Ratio: 11 (ref 9–20)
BUN: 12 mg/dL (ref 6–24)
Bilirubin Total: 0.2 mg/dL (ref 0.0–1.2)
CO2: 23 mmol/L (ref 20–29)
Calcium: 9.4 mg/dL (ref 8.7–10.2)
Chloride: 101 mmol/L (ref 96–106)
Creatinine, Ser: 1.11 mg/dL (ref 0.76–1.27)
GFR calc Af Amer: 85 mL/min/{1.73_m2} (ref >59)
GFR calc non Af Amer: 73 mL/min/{1.73_m2} (ref >59)
Globulin, Total: 2.4 g/dL (ref 1.5–4.5)
Glucose: 134 mg/dL — ABNORMAL HIGH (ref 65–99)
Potassium: 3.9 mmol/L (ref 3.5–5.2)
Sodium: 140 mmol/L (ref 134–144)
Total Protein: 6.9 g/dL (ref 6.0–8.5)

## 2020-01-07 LAB — CBC WITH DIFFERENTIAL/PLATELET
Basophils Absolute: 0.1 10*3/uL (ref 0.0–0.2)
Basos: 1 %
EOS (ABSOLUTE): 0.2 10*3/uL (ref 0.0–0.4)
Eos: 2 %
Hematocrit: 40 % (ref 37.5–51.0)
Hemoglobin: 13.9 g/dL (ref 13.0–17.7)
Immature Grans (Abs): 0 10*3/uL (ref 0.0–0.1)
Immature Granulocytes: 0 %
Lymphocytes Absolute: 2.3 10*3/uL (ref 0.7–3.1)
Lymphs: 31 %
MCH: 34.1 pg — ABNORMAL HIGH (ref 26.6–33.0)
MCHC: 34.8 g/dL (ref 31.5–35.7)
MCV: 98 fL — ABNORMAL HIGH (ref 79–97)
Monocytes Absolute: 0.5 10*3/uL (ref 0.1–0.9)
Monocytes: 6 %
Neutrophils Absolute: 4.4 10*3/uL (ref 1.4–7.0)
Neutrophils: 60 %
Platelets: 179 10*3/uL (ref 150–450)
RBC: 4.08 x10E6/uL — ABNORMAL LOW (ref 4.14–5.80)
RDW: 12.6 % (ref 11.6–15.4)
WBC: 7.4 10*3/uL (ref 3.4–10.8)

## 2020-01-07 LAB — HCV RNA QUANT: Hepatitis C Quantitation: NOT DETECTED IU/mL

## 2020-01-10 ENCOUNTER — Other Ambulatory Visit: Payer: Self-pay | Admitting: Family Medicine

## 2020-01-10 DIAGNOSIS — Z23 Encounter for immunization: Secondary | ICD-10-CM

## 2020-02-17 ENCOUNTER — Ambulatory Visit: Payer: Medicaid Other | Admitting: Family Medicine

## 2020-02-20 ENCOUNTER — Telehealth: Payer: Self-pay

## 2020-02-20 DIAGNOSIS — Z122 Encounter for screening for malignant neoplasm of respiratory organs: Secondary | ICD-10-CM

## 2020-02-20 DIAGNOSIS — Z87891 Personal history of nicotine dependence: Secondary | ICD-10-CM

## 2020-02-20 NOTE — Telephone Encounter (Signed)
Patient has been notified that the low dose lung cancer screening CT scan is due currently or will be in near future.  Confirmed that patient is within the appropriate age range and asymptomatic, (no signs or symptoms of lung cancer).  Patient denies illness that would prevent curative treatment for lung cancer if found.  Patient is agreeable for CT scan being scheduled.    Verified smoking history (current smoker, with 36 year 0.75 ppd history).   CT scheduled for 03/15/20 @ 8:00.

## 2020-02-23 NOTE — Telephone Encounter (Signed)
Smoking history: current, 37.5 pack year

## 2020-02-23 NOTE — Addendum Note (Signed)
Addended by: Lieutenant Diego on: 02/23/2020 12:08 PM   Modules accepted: Orders

## 2020-02-26 ENCOUNTER — Other Ambulatory Visit: Payer: Self-pay | Admitting: Family Medicine

## 2020-02-26 NOTE — Telephone Encounter (Signed)
Requested Prescriptions  Pending Prescriptions Disp Refills  . lisinopril-hydrochlorothiazide (ZESTORETIC) 20-25 MG tablet [Pharmacy Med Name: LISINOPRIL-HCTZ 20-25 MG TAB] 90 tablet 1    Sig: TAKE 1 TABLET BY MOUTH EVERY DAY     Cardiovascular:  ACEI + Diuretic Combos Passed - 02/26/2020  2:34 PM      Passed - Na in normal range and within 180 days    Sodium  Date Value Ref Range Status  01/05/2020 140 134 - 144 mmol/L Final         Passed - K in normal range and within 180 days    Potassium  Date Value Ref Range Status  01/05/2020 3.9 3.5 - 5.2 mmol/L Final         Passed - Cr in normal range and within 180 days    Creatinine, Ser  Date Value Ref Range Status  01/05/2020 1.11 0.76 - 1.27 mg/dL Final         Passed - Ca in normal range and within 180 days    Calcium  Date Value Ref Range Status  01/05/2020 9.4 8.7 - 10.2 mg/dL Final         Passed - Patient is not pregnant      Passed - Last BP in normal range    BP Readings from Last 1 Encounters:  01/05/20 133/68         Passed - Valid encounter within last 6 months    Recent Outpatient Visits          1 month ago RUQ pain   [place], Megan P, DO   4 months ago Routine general medical examination at a health care facility   [facility], [place], DO   6 months ago Essential hypertension   [place], [place], DO   1 year ago Essential hypertension   [place], Megan P, DO   1 year ago Routine general medical examination at a health care facility   [facility], [place], DO

## 2020-03-13 ENCOUNTER — Other Ambulatory Visit: Payer: Self-pay | Admitting: Family Medicine

## 2020-03-13 NOTE — Telephone Encounter (Signed)
Requested medication (s) are due for refill today: yes  Requested medication (s) are on the active medication list: yes  Last refill:  01/05/20 #30 with 1 refill  Future visit scheduled: yes  Notes to clinic:  Please review for refill. Refills not delegated per protocol.     Requested Prescriptions  Pending Prescriptions Disp Refills   cyclobenzaprine (FLEXERIL) 10 MG tablet [Pharmacy Med Name: CYCLOBENZAPRINE 10 MG TABLET] 30 tablet 1    Sig: TAKE 1 TABLET BY MOUTH EVERYDAY AT BEDTIME      Not Delegated - Analgesics:  Muscle Relaxants Failed - 03/13/2020 11:10 AM      Failed - This refill cannot be delegated      Passed - Valid encounter within last 6 months    Recent Outpatient Visits           2 months ago RUQ pain   [place], [place], DO   5 months ago Routine general medical examination at a health care facility   [facility], [place], DO   6 months ago Essential hypertension   [place], [place], DO   1 year ago Essential hypertension   [place], [place], DO   1 year ago Routine general medical examination at a health care facility   [facility], [place], DO

## 2020-03-15 ENCOUNTER — Ambulatory Visit: Admission: RE | Admit: 2020-03-15 | Payer: Medicaid Other | Source: Ambulatory Visit

## 2020-03-20 ENCOUNTER — Telehealth: Payer: Self-pay

## 2020-03-20 NOTE — Telephone Encounter (Signed)
Contacted patient to reschedule missed lung CT screening appt on 03/15/20.  Patient was apologetic and wanted to reschedule.  We scheduled patient for Thursday, July 29 at 2:00.  Patient is aware of location of appointment.  He states he has had both COVID vaccines and his last vaccine has been more than 4 weeks ago.

## 2020-04-05 ENCOUNTER — Ambulatory Visit: Admission: RE | Admit: 2020-04-05 | Payer: Medicaid Other | Source: Ambulatory Visit

## 2020-04-12 ENCOUNTER — Ambulatory Visit
Admission: RE | Admit: 2020-04-12 | Discharge: 2020-04-12 | Disposition: A | Discharge status: Home / Self Care | Payer: Medicaid Other | Source: Ambulatory Visit | Attending: Oncology | Admitting: Oncology

## 2020-04-12 ENCOUNTER — Other Ambulatory Visit: Payer: Self-pay

## 2020-04-12 DIAGNOSIS — Z87891 Personal history of nicotine dependence: Secondary | ICD-10-CM

## 2020-04-12 DIAGNOSIS — Z122 Encounter for screening for malignant neoplasm of respiratory organs: Secondary | ICD-10-CM

## 2020-05-24 ENCOUNTER — Other Ambulatory Visit: Payer: Self-pay | Admitting: Family Medicine

## 2020-07-02 DIAGNOSIS — H2512 Age-related nuclear cataract, left eye: Secondary | ICD-10-CM | POA: Diagnosis not present

## 2020-07-03 ENCOUNTER — Other Ambulatory Visit: Payer: Self-pay | Admitting: Nurse Practitioner

## 2020-07-03 DIAGNOSIS — R739 Hyperglycemia, unspecified: Secondary | ICD-10-CM

## 2020-07-05 ENCOUNTER — Ambulatory Visit: Payer: Medicaid Other | Admitting: Unknown Physician Specialty

## 2020-07-10 ENCOUNTER — Other Ambulatory Visit: Payer: Self-pay

## 2020-07-10 ENCOUNTER — Ambulatory Visit
Admission: RE | Admit: 2020-07-10 | Discharge: 2020-07-10 | Disposition: A | Discharge status: Home / Self Care | Payer: Medicaid Other | Source: Ambulatory Visit | Attending: Family Medicine | Admitting: Family Medicine

## 2020-07-10 ENCOUNTER — Encounter: Payer: Self-pay | Admitting: Family Medicine

## 2020-07-10 ENCOUNTER — Ambulatory Visit (INDEPENDENT_AMBULATORY_CARE_PROVIDER_SITE_OTHER): Payer: Medicaid Other | Admitting: Family Medicine

## 2020-07-10 ENCOUNTER — Ambulatory Visit
Admission: RE | Admit: 2020-07-10 | Discharge: 2020-07-10 | Disposition: A | Discharge status: Home / Self Care | Payer: Medicaid Other | Attending: Family Medicine | Admitting: Family Medicine

## 2020-07-10 ENCOUNTER — Ambulatory Visit: Admission: RE | Admit: 2020-07-10 | Payer: Medicaid Other | Source: Home / Self Care

## 2020-07-10 VITALS — BP 122/76 | HR 99 | Temp 98.4°F | Ht 68.0 in | Wt 252.0 lb

## 2020-07-10 DIAGNOSIS — M25551 Pain in right hip: Secondary | ICD-10-CM

## 2020-07-10 DIAGNOSIS — M5410 Radiculopathy, site unspecified: Secondary | ICD-10-CM

## 2020-07-10 DIAGNOSIS — Z23 Encounter for immunization: Secondary | ICD-10-CM | POA: Diagnosis not present

## 2020-07-10 DIAGNOSIS — M79604 Pain in right leg: Secondary | ICD-10-CM | POA: Diagnosis not present

## 2020-07-10 DIAGNOSIS — R739 Hyperglycemia, unspecified: Secondary | ICD-10-CM | POA: Diagnosis not present

## 2020-07-10 DIAGNOSIS — M47816 Spondylosis without myelopathy or radiculopathy, lumbar region: Secondary | ICD-10-CM | POA: Diagnosis not present

## 2020-07-10 DIAGNOSIS — M47817 Spondylosis without myelopathy or radiculopathy, lumbosacral region: Secondary | ICD-10-CM | POA: Diagnosis not present

## 2020-07-10 DIAGNOSIS — M1611 Unilateral primary osteoarthritis, right hip: Secondary | ICD-10-CM | POA: Diagnosis not present

## 2020-07-10 DIAGNOSIS — M25751 Osteophyte, right hip: Secondary | ICD-10-CM | POA: Diagnosis not present

## 2020-07-10 DIAGNOSIS — M533 Sacrococcygeal disorders, not elsewhere classified: Secondary | ICD-10-CM | POA: Diagnosis not present

## 2020-07-10 LAB — BAYER DCA HB A1C WAIVED: HB A1C (BAYER DCA - WAIVED): 5.8 % (ref <7.0)

## 2020-07-10 MED ORDER — PREDNISONE 10 MG PO TABS
ORAL_TABLET | ORAL | 0 refills | Status: DC
Start: 2020-07-10 — End: 2020-07-31

## 2020-07-10 MED ORDER — BACLOFEN 10 MG PO TABS
10.0000 mg | ORAL_TABLET | Freq: Every day | ORAL | 0 refills | Status: DC
Start: 2020-07-10 — End: 2020-07-31

## 2020-07-10 NOTE — Progress Notes (Signed)
BP 122/76   Pulse 99   Temp 98.4 F (36.9 C) (Oral)   Ht 5\' 8"  (1.727 m)   Wt 252 lb (114.3 kg)   SpO2 95%   BMI 38.32 kg/m    Subjective:    Patient ID: Jimmy Simmons, male    DOB: Jun 08, 1961, 59 y.o.   MRN: [id number]  HPI: Jimmy Simmons is a 59 y.o. male  Chief Complaint  Patient presents with  . Hip Pain    X3 weeks, right hip, pain shoots down leg, constant pain, makes foot numb, hurts more when standing than sitting   . Flu Vaccine  . shingrex    want vaccine    BACK/HIP PAIN Duration: 3 weeks Mechanism of injury: unknown Location: R hip and down R lateral side of his leg Onset: sudden Severity: moderate Quality: dull throbbing Frequency: constant, waxing and waning Radiation: R leg below the knee Aggravating factors: movement, walking Alleviating factors: nothing Status: stable Treatments attempted: pain medicine, rest, APAP, ibuprofen and aleve  Relief with NSAIDs?: mild Nighttime pain:  no Paresthesias / decreased sensation:  yes Bowel / bladder incontinence:  no Fevers:  no Dysuria / urinary frequency:  no  Relevant past medical, surgical, family and social history reviewed and updated as indicated. Interim medical history since our last visit reviewed. Allergies and medications reviewed and updated.  Review of Systems  Constitutional: Negative.   Respiratory: Negative.   Cardiovascular: Negative.   Gastrointestinal: Negative.   Musculoskeletal: Positive for arthralgias and myalgias. Negative for back pain, gait problem, joint swelling, neck pain and neck stiffness.  Skin: Negative.   Neurological: Negative.   Psychiatric/Behavioral: Negative.     Per HPI unless specifically indicated above     Objective:    BP 122/76   Pulse 99   Temp 98.4 F (36.9 C) (Oral)   Ht 5\' 8"  (1.727 m)   Wt 252 lb (114.3 kg)   SpO2 95%   BMI 38.32 kg/m   Wt Readings from Last 3 Encounters:  07/10/20 252 lb (114.3 kg)  04/12/20 250 lb (113.4 kg)  01/05/20 258  lb (117 kg)    Physical Exam Vitals and nursing note reviewed.  Constitutional:      General: He is not in acute distress.    Appearance: Normal appearance. He is not ill-appearing, toxic-appearing or diaphoretic.  HENT:     Head: Normocephalic and atraumatic.     Right Ear: External ear normal.     Left Ear: External ear normal.     Nose: Nose normal.     Mouth/Throat:     Mouth: Mucous membranes are moist.     Pharynx: Oropharynx is clear.  Eyes:     General: No scleral icterus.       Right eye: No discharge.        Left eye: No discharge.     Extraocular Movements: Extraocular movements intact.     Conjunctiva/sclera: Conjunctivae normal.     Pupils: Pupils are equal, round, and reactive to light.  Cardiovascular:     Rate and Rhythm: Normal rate and regular rhythm.     Pulses: Normal pulses.     Heart sounds: Normal heart sounds. No murmur heard.  No friction rub. No gallop.   Pulmonary:     Effort: Pulmonary effort is normal. No respiratory distress.     Breath sounds: Normal breath sounds. No stridor. No wheezing, rhonchi or rales.  Chest:     Chest wall: No tenderness.  Musculoskeletal:        General: Normal range of motion.     Cervical back: Normal range of motion and neck supple.  Skin:    General: Skin is warm and dry.     Capillary Refill: Capillary refill takes less than 2 seconds.     Coloration: Skin is not jaundiced or pale.     Findings: No bruising, erythema, lesion or rash.  Neurological:     General: No focal deficit present.     Mental Status: He is alert and oriented to person, place, and time. Mental status is at baseline.  Psychiatric:        Mood and Affect: Mood normal.        Behavior: Behavior normal.        Thought Content: Thought content normal.        Judgment: Judgment normal.     Results for orders placed or performed in visit on 01/05/20  Comprehensive metabolic panel  Result Value Ref Range   Glucose 134 (H) 65 - 99 mg/dL    BUN 12 6 - 24 mg/dL   Creatinine, Ser 1.11 0.76 - 1.27 mg/dL   GFR calc non Af Amer 73 >59 mL/min/1.73   GFR calc Af Amer 85 >59 mL/min/1.73   BUN/Creatinine Ratio 11 9 - 20   Sodium 140 134 - 144 mmol/L   Potassium 3.9 3.5 - 5.2 mmol/L   Chloride 101 96 - 106 mmol/L   CO2 23 20 - 29 mmol/L   Calcium 9.4 8.7 - 10.2 mg/dL   Total Protein 6.9 6.0 - 8.5 g/dL   Albumin 4.5 3.8 - 4.9 g/dL   Globulin, Total 2.4 1.5 - 4.5 g/dL   Albumin/Globulin Ratio 1.9 1.2 - 2.2   Bilirubin Total 0.2 0.0 - 1.2 mg/dL   Alkaline Phosphatase 60 39 - 117 IU/L   AST 20 0 - 40 IU/L   ALT 23 0 - 44 IU/L  CBC with Differential/Platelet  Result Value Ref Range   WBC 7.4 3.4 - 10.8 x10E3/uL   RBC 4.08 (L) 4.14 - 5.80 x10E6/uL   Hemoglobin 13.9 13.0 - 17.7 g/dL   Hematocrit 40.0 37.5 - 51.0 %   MCV 98 (H) 79 - 97 fL   MCH 34.1 (H) 26.6 - 33.0 pg   MCHC 34.8 31 - 35 g/dL   RDW 12.6 11.6 - 15.4 %   Platelets 179 150 - 450 x10E3/uL   Neutrophils 60 Not Estab. %   Lymphs 31 Not Estab. %   Monocytes 6 Not Estab. %   Eos 2 Not Estab. %   Basos 1 Not Estab. %   Neutrophils Absolute 4.4 1.40 - 7.00 x10E3/uL   Lymphocytes Absolute 2.3 0 - 3 x10E3/uL   Monocytes Absolute 0.5 0 - 0 x10E3/uL   EOS (ABSOLUTE) 0.2 0.0 - 0.4 x10E3/uL   Basophils Absolute 0.1 0 - 0 x10E3/uL   Immature Granulocytes 0 Not Estab. %   Immature Grans (Abs) 0.0 0.0 - 0.1 x10E3/uL  HCV RNA quant  Result Value Ref Range   Hepatitis C Quantitation HCV Not Detected IU/mL   Test Information Comment   Acute Hep Panel & Hep B Surface Ab  Result Value Ref Range   Hep A IgM Negative Negative   Hepatitis B Surface Ag Negative Negative   Hep B C IgM Negative Negative   Hepatitis B Surf Ab Quant 7.9 (L) Immunity>9.9 mIU/mL   Hep C Virus Ab >11.0 (H) 0.0 - 0.9  s/co ratio      Assessment & Plan:   Problem List Items Addressed This Visit    None    Visit Diagnoses    Right hip pain    -  Primary   Will check x-rays, concern for radicular  pain. Treat with baclofen and prednisone. Call with any concerns. Recheck 2-3 weeks.    Relevant Orders   DG Hip Unilat W OR W/O Pelvis 2-3 Views Right   DG Lumbar Spine Complete   Radicular leg pain       Will check x-rays, concern for radicular pain. Treat with baclofen and prednisone. Call with any concerns. Recheck 2-3 weeks.    Relevant Orders   DG Hip Unilat W OR W/O Pelvis 2-3 Views Right   DG Lumbar Spine Complete   Elevated serum glucose           Follow up plan: Return 2-3 weeks follow up hip/leg pain.

## 2020-07-16 NOTE — Progress Notes (Signed)
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 I think a little PT should help to make him feel better. If he's OK I'll put the order in for him.

## 2020-07-17 ENCOUNTER — Other Ambulatory Visit: Payer: Self-pay | Admitting: Family Medicine

## 2020-07-17 DIAGNOSIS — M25551 Pain in right hip: Secondary | ICD-10-CM

## 2020-07-17 DIAGNOSIS — M5410 Radiculopathy, site unspecified: Secondary | ICD-10-CM

## 2020-07-20 ENCOUNTER — Telehealth: Payer: Self-pay | Admitting: Family Medicine

## 2020-07-20 MED ORDER — TIZANIDINE HCL 4 MG PO TABS
4.0000 mg | ORAL_TABLET | Freq: Four times a day (QID) | ORAL | 0 refills | Status: DC | PRN
Start: 2020-07-20 — End: 2020-07-31

## 2020-07-20 NOTE — Telephone Encounter (Signed)
Called and spoke to patient. He states he would like the Carisoprodol to be sent it.

## 2020-07-20 NOTE — Telephone Encounter (Signed)
Pt called to report that his current muscle relaxing medication is not working  He is requesting to switch back to Zanaflex and Carisoprodol   [id number] [address], [place]  [place] [address]  Phone: [phone] Fax: [phone]

## 2020-07-20 NOTE — Telephone Encounter (Signed)
He cannot take both of those. Which one does he want?

## 2020-07-25 ENCOUNTER — Encounter: Payer: Self-pay | Admitting: *Deleted

## 2020-07-31 ENCOUNTER — Encounter: Payer: Self-pay | Admitting: Family Medicine

## 2020-07-31 ENCOUNTER — Other Ambulatory Visit: Payer: Self-pay

## 2020-07-31 ENCOUNTER — Ambulatory Visit (INDEPENDENT_AMBULATORY_CARE_PROVIDER_SITE_OTHER): Payer: Medicaid Other | Admitting: Family Medicine

## 2020-07-31 VITALS — BP 148/76 | HR 100 | Temp 98.5°F | Wt 258.4 lb

## 2020-07-31 DIAGNOSIS — H25042 Posterior subcapsular polar age-related cataract, left eye: Secondary | ICD-10-CM | POA: Diagnosis not present

## 2020-07-31 DIAGNOSIS — M5410 Radiculopathy, site unspecified: Secondary | ICD-10-CM

## 2020-07-31 DIAGNOSIS — I10 Essential (primary) hypertension: Secondary | ICD-10-CM | POA: Diagnosis not present

## 2020-07-31 MED ORDER — GABAPENTIN 100 MG PO CAPS: 100.0000 mg | ORAL_CAPSULE | Freq: Three times a day (TID) | ORAL | 3 refills | Status: DC

## 2020-07-31 MED ORDER — KETOROLAC TROMETHAMINE 60 MG/2ML IM SOLN
60.0000 mg | Freq: Once | INTRAMUSCULAR | Status: AC
  Administered 2020-07-31: 60 mg via INTRAMUSCULAR

## 2020-07-31 MED ORDER — IBUPROFEN 600 MG PO TABS: 600.0000 mg | ORAL_TABLET | Freq: Three times a day (TID) | ORAL | 1 refills | Status: DC | PRN

## 2020-07-31 MED ORDER — TIZANIDINE HCL 4 MG PO TABS: 4.0000 mg | ORAL_TABLET | Freq: Four times a day (QID) | ORAL | 1 refills | Status: DC | PRN

## 2020-07-31 NOTE — Progress Notes (Signed)
BP (!) 148/76   Pulse 100   Temp 98.5 F (36.9 C)   Wt 258 lb 6.4 oz (117.2 kg)   SpO2 98%   BMI 39.29 kg/m    Subjective:    Patient ID: Jimmy Simmons, male    DOB: 1961-05-04, 59 y.o.   MRN: [id number]  HPI: Jimmy Simmons is a 59 y.o. male  Chief Complaint  Patient presents with  . Hip Pain    pt states pain has gotten worse since the last time he was here  . Leg Pain    pt states pain is worse    BACK PAIN- felt like the prednisone gave him energy but it didn't help with the pain Duration: 6 weeks Mechanism of injury: unsure Location: R hip and down R lateral side of his leg Onset: sudden Severity: severe Quality: shooting, aching Frequency: constant, waxing and waning Radiation: R leg below the knee Aggravating factors: movement, walking Alleviating factors: nothing  Status: worse Treatments attempted: muscle relaxer, prednisone, rest, ice, heat, APAP, ibuprofen and aleve  Relief with NSAIDs?: no Nighttime pain:  no Paresthesias / decreased sensation:  no Bowel / bladder incontinence:  no Fevers:  no Dysuria / urinary frequency:  no  Relevant past medical, surgical, family and social history reviewed and updated as indicated. Interim medical history since our last visit reviewed. Allergies and medications reviewed and updated.  Review of Systems  Constitutional: Negative.   HENT: Negative.   Respiratory: Negative.   Cardiovascular: Negative.   Gastrointestinal: Negative.   Musculoskeletal: Positive for arthralgias, back pain and myalgias. Negative for gait problem, joint swelling, neck pain and neck stiffness.  Skin: Negative.   Neurological: Negative.   Psychiatric/Behavioral: Negative.     Per HPI unless specifically indicated above     Objective:    BP (!) 148/76   Pulse 100   Temp 98.5 F (36.9 C)   Wt 258 lb 6.4 oz (117.2 kg)   SpO2 98%   BMI 39.29 kg/m   Wt Readings from Last 3 Encounters:  07/31/20 258 lb 6.4 oz (117.2 kg)  07/10/20 252  lb (114.3 kg)  04/12/20 250 lb (113.4 kg)    Physical Exam Vitals and nursing note reviewed.  Constitutional:      General: He is not in acute distress.    Appearance: Normal appearance. He is not ill-appearing, toxic-appearing or diaphoretic.  HENT:     Head: Normocephalic and atraumatic.     Right Ear: External ear normal.     Left Ear: External ear normal.     Nose: Nose normal.     Mouth/Throat:     Mouth: Mucous membranes are moist.     Pharynx: Oropharynx is clear.  Eyes:     General: No scleral icterus.       Right eye: No discharge.        Left eye: No discharge.     Extraocular Movements: Extraocular movements intact.     Conjunctiva/sclera: Conjunctivae normal.     Pupils: Pupils are equal, round, and reactive to light.  Cardiovascular:     Rate and Rhythm: Normal rate and regular rhythm.     Pulses: Normal pulses.     Heart sounds: Normal heart sounds. No murmur heard.  No friction rub. No gallop.   Pulmonary:     Effort: Pulmonary effort is normal. No respiratory distress.     Breath sounds: Normal breath sounds. No stridor. No wheezing, rhonchi or rales.  Chest:  Chest wall: No tenderness.  Musculoskeletal:        General: Normal range of motion.     Cervical back: Normal range of motion and neck supple.  Skin:    General: Skin is warm and dry.     Capillary Refill: Capillary refill takes less than 2 seconds.     Coloration: Skin is not jaundiced or pale.     Findings: No bruising, erythema, lesion or rash.  Neurological:     General: No focal deficit present.     Mental Status: He is alert and oriented to person, place, and time. Mental status is at baseline.  Psychiatric:        Mood and Affect: Mood normal.        Behavior: Behavior normal.        Thought Content: Thought content normal.        Judgment: Judgment normal.     Results for orders placed or performed in visit on 07/10/20  Bayer DCA Hb A1c Waived (STAT)  Result Value Ref Range   HB  A1C (BAYER DCA - WAIVED) 5.8 <7.0 %      Assessment & Plan:   Problem List Items Addressed This Visit    None    Visit Diagnoses    Radicular leg pain    -  Primary   Will treat with toradol shot and gabapentin. To start PT tomorrow. Referral to ortho made today.    Relevant Medications   ketorolac (TORADOL) injection 60 mg   Other Relevant Orders   Ambulatory referral to Orthopedic Surgery       Follow up plan: Return in about 4 weeks (around 08/28/2020).

## 2020-07-31 NOTE — Patient Instructions (Addendum)
Access Code: [email or web address] URL: [email or web address] Date: 08/01/2020 Prepared by: Roxana Hires  Exercises Seated Piriformis Stretch - 2 x daily - 7 x weekly - 3 reps - 30s hold Seated Figure 4 Piriformis Stretch - 2 x daily - 7 x weekly - 3 reps - 30s hold Seated Slump Nerve Glide - 2 x daily - 7 x weekly - 2 sets - 10 reps - 3s hold Prone on Elbows Stretch - 2 x daily - 7 x weekly - progress to 5 minutes hold

## 2020-08-01 ENCOUNTER — Other Ambulatory Visit: Payer: Self-pay | Admitting: Family Medicine

## 2020-08-01 ENCOUNTER — Ambulatory Visit: Payer: Medicaid Other | Attending: Family Medicine

## 2020-08-01 DIAGNOSIS — M25551 Pain in right hip: Secondary | ICD-10-CM | POA: Diagnosis present
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
## 2020-08-01 NOTE — Telephone Encounter (Signed)
Requested medication (s) are due for refill today - no- discontinued  Requested medication (s) are on the active medication list -no  Future visit scheduled -no  Last refill: 07/10/20  Notes to clinic: Request non delegated Rx- discontinued by PCP  Requested Prescriptions  Pending Prescriptions Disp Refills   baclofen (LIORESAL) 10 MG tablet [Pharmacy Med Name: BACLOFEN 10 MG TABLET] 30 tablet     Sig: TAKE 1 TABLET BY MOUTH EVERYDAY AT BEDTIME      Not Delegated - Analgesics:  Muscle Relaxants Failed - 08/01/2020  1:32 PM      Failed - This refill cannot be delegated      Passed - Valid encounter within last 6 months    Recent Outpatient Visits           Yesterday Radicular leg pain   [place], Megan P, DO   3 weeks ago Right hip pain   [place], Megan P, DO   6 months ago RUQ pain   [place], Megan P, DO   9 months ago Routine general medical examination at a health care facility   [facility], [place], DO   11 months ago Essential hypertension   [place], Megan P, DO       Future Appointments             In 1 month Johnson, Megan P, DO [place] [place]                Requested Prescriptions  Pending Prescriptions Disp Refills   baclofen (LIORESAL) 10 MG tablet [Pharmacy Med Name: BACLOFEN 10 MG TABLET] 30 tablet     Sig: TAKE 1 TABLET BY MOUTH EVERYDAY AT BEDTIME      Not Delegated - Analgesics:  Muscle Relaxants Failed - 08/01/2020  1:32 PM      Failed - This refill cannot be delegated      Passed - Valid encounter within last 6 months    Recent Outpatient Visits           Yesterday Radicular leg pain   [place], Megan P, DO   3 weeks ago Right hip pain   [place], Megan P, DO   6 months ago RUQ pain   [place], Megan P, DO   9 months ago Routine  general medical examination at a health care facility   [facility], [place], DO   11 months ago Essential hypertension   [place], [place], DO       Future Appointments             In 1 month Johnson, Barb Merino, DO [employer]

## 2020-08-06 ENCOUNTER — Ambulatory Visit: Payer: Medicaid Other

## 2020-08-06 ENCOUNTER — Other Ambulatory Visit: Payer: Self-pay

## 2020-08-06 DIAGNOSIS — M5416 Radiculopathy, lumbar region: Secondary | ICD-10-CM | POA: Diagnosis not present

## 2020-08-06 DIAGNOSIS — M25551 Pain in right hip: Secondary | ICD-10-CM | POA: Diagnosis not present

## 2020-08-06 DIAGNOSIS — M5441 Lumbago with sciatica, right side: Secondary | ICD-10-CM | POA: Diagnosis not present

## 2020-08-06 DIAGNOSIS — M545 Low back pain, unspecified: Secondary | ICD-10-CM | POA: Diagnosis not present
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
## 2020-08-06 NOTE — Telephone Encounter (Signed)
Was discontinues, does patient need an appt to get a new prescription

## 2020-08-07 ENCOUNTER — Other Ambulatory Visit: Payer: Self-pay | Admitting: Physician Assistant

## 2020-08-07 DIAGNOSIS — M5441 Lumbago with sciatica, right side: Secondary | ICD-10-CM

## 2020-08-07 DIAGNOSIS — M545 Low back pain, unspecified: Secondary | ICD-10-CM

## 2020-08-09 ENCOUNTER — Other Ambulatory Visit: Payer: Self-pay

## 2020-08-09 ENCOUNTER — Ambulatory Visit: Payer: Medicaid Other | Attending: Family Medicine

## 2020-08-09 DIAGNOSIS — M546 Pain in thoracic spine: Secondary | ICD-10-CM | POA: Insufficient documentation

## 2020-08-09 DIAGNOSIS — M25551 Pain in right hip: Secondary | ICD-10-CM | POA: Insufficient documentation
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
## 2020-08-14 ENCOUNTER — Other Ambulatory Visit
Admission: RE | Admit: 2020-08-14 | Discharge: 2020-08-14 | Disposition: A | Discharge status: Home / Self Care | Payer: Medicaid Other | Source: Ambulatory Visit | Attending: Ophthalmology | Admitting: Ophthalmology

## 2020-08-14 ENCOUNTER — Ambulatory Visit: Payer: Medicaid Other

## 2020-08-14 ENCOUNTER — Other Ambulatory Visit: Payer: Self-pay

## 2020-08-14 DIAGNOSIS — M25551 Pain in right hip: Secondary | ICD-10-CM | POA: Diagnosis not present

## 2020-08-14 DIAGNOSIS — Z01812 Encounter for preprocedural laboratory examination: Secondary | ICD-10-CM | POA: Diagnosis present

## 2020-08-14 DIAGNOSIS — Z20822 Contact with and (suspected) exposure to covid-19: Secondary | ICD-10-CM | POA: Insufficient documentation
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
## 2020-08-14 NOTE — Discharge Instructions (Signed)

## 2020-08-15 LAB — SARS CORONAVIRUS 2 (TAT 6-24 HRS): SARS Coronavirus 2: NEGATIVE

## 2020-08-16 ENCOUNTER — Encounter: Payer: Self-pay | Admitting: Ophthalmology

## 2020-08-16 ENCOUNTER — Ambulatory Visit
Admission: RE | Admit: 2020-08-16 | Discharge: 2020-08-16 | Disposition: A | Discharge status: Home / Self Care | Payer: Medicaid Other | Attending: Ophthalmology | Admitting: Ophthalmology

## 2020-08-16 ENCOUNTER — Ambulatory Visit: Payer: Medicaid Other | Admitting: Anesthesiology

## 2020-08-16 ENCOUNTER — Other Ambulatory Visit: Payer: Self-pay

## 2020-08-16 ENCOUNTER — Encounter
Admission: RE | Disposition: A | Discharge status: Home / Self Care | Payer: Self-pay | Source: Home / Self Care | Attending: Ophthalmology

## 2020-08-16 DIAGNOSIS — Z79899 Other long term (current) drug therapy: Secondary | ICD-10-CM | POA: Insufficient documentation

## 2020-08-16 DIAGNOSIS — H2512 Age-related nuclear cataract, left eye: Secondary | ICD-10-CM | POA: Insufficient documentation

## 2020-08-16 DIAGNOSIS — F17210 Nicotine dependence, cigarettes, uncomplicated: Secondary | ICD-10-CM | POA: Insufficient documentation

## 2020-08-16 DIAGNOSIS — H25812 Combined forms of age-related cataract, left eye: Secondary | ICD-10-CM | POA: Diagnosis not present

## 2020-08-16 DIAGNOSIS — H25042 Posterior subcapsular polar age-related cataract, left eye: Secondary | ICD-10-CM | POA: Diagnosis not present

## 2020-08-16 DIAGNOSIS — Z7951 Long term (current) use of inhaled steroids: Secondary | ICD-10-CM | POA: Diagnosis not present

## 2020-08-16 HISTORY — PX: CATARACT EXTRACTION W/PHACO: SHX586

## 2020-08-16 SURGERY — PHACOEMULSIFICATION, CATARACT, WITH IOL INSERTION
Anesthesia: Monitor Anesthesia Care | Site: Eye | Laterality: Left

## 2020-08-16 MED ORDER — FENTANYL CITRATE (PF) 100 MCG/2ML IJ SOLN
INTRAMUSCULAR | Status: DC | PRN
  Administered 2020-08-16: 50 ug via INTRAVENOUS

## 2020-08-16 MED ORDER — ARMC OPHTHALMIC DILATING DROPS
1.0000 "application " | OPHTHALMIC | Status: DC | PRN
  Administered 2020-08-16 (×3): 1 via OPHTHALMIC

## 2020-08-16 MED ORDER — ACETAMINOPHEN 160 MG/5ML PO SOLN: 325.0000 mg | Freq: Once | ORAL | Status: DC

## 2020-08-16 MED ORDER — LACTATED RINGERS IV SOLN: INTRAVENOUS | Status: DC

## 2020-08-16 MED ORDER — NA CHONDROIT SULF-NA HYALURON 40-17 MG/ML IO SOLN
INTRAOCULAR | Status: DC | PRN
  Administered 2020-08-16: 1 mL via INTRAOCULAR

## 2020-08-16 MED ORDER — MOXIFLOXACIN HCL 0.5 % OP SOLN
OPHTHALMIC | Status: DC | PRN
  Administered 2020-08-16: 0.2 mL via OPHTHALMIC

## 2020-08-16 MED ORDER — ACETAMINOPHEN 325 MG PO TABS: 325.0000 mg | ORAL_TABLET | Freq: Once | ORAL | Status: DC

## 2020-08-16 MED ORDER — BRIMONIDINE TARTRATE-TIMOLOL 0.2-0.5 % OP SOLN
OPHTHALMIC | Status: DC | PRN
  Administered 2020-08-16: 1 [drp] via OPHTHALMIC

## 2020-08-16 MED ORDER — MIDAZOLAM HCL 2 MG/2ML IJ SOLN
INTRAMUSCULAR | Status: DC | PRN
  Administered 2020-08-16 (×2): 1 mg via INTRAVENOUS

## 2020-08-16 MED ORDER — TETRACAINE HCL 0.5 % OP SOLN
1.0000 [drp] | OPHTHALMIC | Status: DC | PRN
  Administered 2020-08-16 (×3): 1 [drp] via OPHTHALMIC

## 2020-08-16 MED ORDER — EPINEPHRINE PF 1 MG/ML IJ SOLN
INTRAOCULAR | Status: DC | PRN
  Administered 2020-08-16: 32 mL via OPHTHALMIC

## 2020-08-16 MED ORDER — LIDOCAINE HCL (PF) 2 % IJ SOLN
INTRAOCULAR | Status: DC | PRN
  Administered 2020-08-16: 2 mL

## 2020-08-16 SURGICAL SUPPLY — 18 items
CANNULA ANT/CHMB 27GA (MISCELLANEOUS) ×6 IMPLANT
GLOVE SURG LX 8.0 MICRO (GLOVE) ×2
GLOVE SURG LX STRL 8.0 MICRO (GLOVE) ×1 IMPLANT
GLOVE SURG TRIUMPH 8.0 PF LTX (GLOVE) ×3 IMPLANT
GOWN STRL REUS W/ TWL LRG LVL3 (GOWN DISPOSABLE) ×2 IMPLANT
GOWN STRL REUS W/TWL LRG LVL3 (GOWN DISPOSABLE) ×6
LENS IOL DIOP 20.0 (Intraocular Lens) ×3 IMPLANT
LENS IOL TECNIS MONO 20.0 (Intraocular Lens) ×1 IMPLANT
MARKER SKIN DUAL TIP RULER LAB (MISCELLANEOUS) ×3 IMPLANT
NEEDLE FILTER BLUNT 18X 1/2SAF (NEEDLE) ×2
NEEDLE FILTER BLUNT 18X1 1/2 (NEEDLE) ×1 IMPLANT
PACK EYE AFTER SURG (MISCELLANEOUS) ×3 IMPLANT
PACK OPTHALMIC (MISCELLANEOUS) ×3 IMPLANT
PACK PORFILIO (MISCELLANEOUS) ×3 IMPLANT
SYR 3ML LL SCALE MARK (SYRINGE) ×3 IMPLANT
SYR TB 1ML LUER SLIP (SYRINGE) ×3 IMPLANT
WATER STERILE IRR 250ML POUR (IV SOLUTION) ×3 IMPLANT
WIPE NON LINTING 3.25X3.25 (MISCELLANEOUS) ×3 IMPLANT

## 2020-08-16 NOTE — Transfer of Care (Signed)
Immediate Anesthesia Transfer of Care Note  Patient: Jimmy Simmons  Procedure(s) Performed: CATARACT EXTRACTION PHACO AND INTRAOCULAR LENS PLACEMENT (IOC) LEFT 2.29 00:22.9 (Left Eye)  Patient Location: PACU  Anesthesia Type: MAC  Level of Consciousness: awake, alert  and patient cooperative  Airway and Oxygen Therapy: Patient Spontanous Breathing and Patient connected to supplemental oxygen  Post-op Assessment: Post-op Vital signs reviewed, Patient's Cardiovascular Status Stable, Respiratory Function Stable, Patent Airway and No signs of Nausea or vomiting  Post-op Vital Signs: Reviewed and stable  Complications: No complications documented.
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
## 2020-08-16 NOTE — Anesthesia Procedure Notes (Signed)
Procedure Name: MAC Date/Time: 08/16/2020 10:29 AM Performed by: Cameron Ali, CRNA Pre-anesthesia Checklist: Patient identified, Emergency Drugs available, Suction available, Timeout performed and Patient being monitored Patient Re-evaluated:Patient Re-evaluated prior to induction Oxygen Delivery Method: Nasal cannula Placement Confirmation: positive ETCO2

## 2020-08-16 NOTE — Anesthesia Preprocedure Evaluation (Signed)
Anesthesia Evaluation  Patient identified by MRN, date of birth, ID band Patient awake    Reviewed: Allergy & Precautions, H&P , NPO status , Patient's Chart, lab work & pertinent test results  Airway Mallampati: II  TM Distance: >3 FB Neck ROM: full    Dental no notable dental hx.    Pulmonary COPD,  COPD inhaler, Current Smoker,    Pulmonary exam normal breath sounds clear to auscultation       Cardiovascular Normal cardiovascular exam Rhythm:regular Rate:Normal     Neuro/Psych PSYCHIATRIC DISORDERS    GI/Hepatic   Endo/Other  Morbid obesity  Renal/GU      Musculoskeletal   Abdominal   Peds  Hematology   Anesthesia Other Findings   Reproductive/Obstetrics                             Anesthesia Physical Anesthesia Plan  ASA: III  Anesthesia Plan: MAC   Post-op Pain Management:    Induction:   PONV Risk Score and Plan: 1 and Treatment may vary due to age or medical condition, TIVA and Midazolam  Airway Management Planned:   Additional Equipment:   Intra-op Plan:   Post-operative Plan:   Informed Consent: I have reviewed the patients History and Physical, chart, labs and discussed the procedure including the risks, benefits and alternatives for the proposed anesthesia with the patient or authorized representative who has indicated his/her understanding and acceptance.     Dental Advisory Given  Plan Discussed with: CRNA  Anesthesia Plan Comments:         Anesthesia Quick Evaluation

## 2020-08-16 NOTE — Op Note (Signed)
PREOPERATIVE DIAGNOSIS:  Nuclear sclerotic cataract of the left eye.   POSTOPERATIVE DIAGNOSIS:  Nuclear sclerotic cataract of the left eye.   OPERATIVE PROCEDURE:@   SURGEON:  Birder Robson, MD.   ANESTHESIA:  Anesthesiologist: Ronelle Nigh, MD CRNA: Cameron Ali, CRNA  1.      Managed anesthesia care. 2.     0.71ml of Shugarcaine was instilled following the paracentesis   COMPLICATIONS:  None.   TECHNIQUE:   Stop and chop   DESCRIPTION OF PROCEDURE:  The patient was examined and consented in the preoperative holding area where the aforementioned topical anesthesia was applied to the left eye and then brought back to the Operating Room where the left eye was prepped and draped in the usual sterile ophthalmic fashion and a lid speculum was placed. A paracentesis was created with the side port blade and the anterior chamber was filled with viscoelastic. A near clear corneal incision was performed with the steel keratome. A continuous curvilinear capsulorrhexis was performed with a cystotome followed by the capsulorrhexis forceps. Hydrodissection and hydrodelineation were carried out with BSS on a blunt cannula. The lens was removed in a stop and chop  technique and the remaining cortical material was removed with the irrigation-aspiration handpiece. The capsular bag was inflated with viscoelastic and the Technis [id number] lens was placed in the capsular bag without complication. The remaining viscoelastic was removed from the eye with the irrigation-aspiration handpiece. The wounds were hydrated. The anterior chamber was flushed with BSS and the eye was inflated to physiologic pressure. 0.49ml Vigamox was placed in the anterior chamber. The wounds were found to be water tight. The eye was dressed with Combigan. The patient was given protective glasses to wear throughout the day and a shield with which to sleep tonight. The patient was also given drops with which to begin a drop regimen today and  will follow-up with me in one day. Implant Name Type Inv. Item Serial No. Manufacturer Lot No. LRB No. Used Action  LENS IOL DIOP 20.0 - [id number] Intraocular Lens LENS IOL DIOP 20.0 [id number] JOHNSON   Left 1 Implanted    Procedure(s): CATARACT EXTRACTION PHACO AND INTRAOCULAR LENS PLACEMENT (IOC) LEFT 2.29 00:22.9 (Left)  Electronically signed: Birder Robson 08/16/2020 10:46 AM

## 2020-08-16 NOTE — Anesthesia Postprocedure Evaluation (Signed)
Anesthesia Post Note  Patient: Jimmy Simmons  Procedure(s) Performed: CATARACT EXTRACTION PHACO AND INTRAOCULAR LENS PLACEMENT (IOC) LEFT 2.29 00:22.9 (Left Eye)     Patient location during evaluation: PACU Anesthesia Type: MAC Level of consciousness: awake and alert and oriented Pain management: satisfactory to patient Vital Signs Assessment: post-procedure vital signs reviewed and stable Respiratory status: spontaneous breathing, nonlabored ventilation and respiratory function stable Cardiovascular status: blood pressure returned to baseline and stable Postop Assessment: Adequate PO intake and No signs of nausea or vomiting Anesthetic complications: no   No complications documented.  Raliegh Ip

## 2020-08-17 ENCOUNTER — Ambulatory Visit: Payer: Medicaid Other

## 2020-08-17 VITALS — BP 146/92 | HR 97

## 2020-08-17 DIAGNOSIS — M546 Pain in thoracic spine: Secondary | ICD-10-CM

## 2020-08-17 DIAGNOSIS — M25551 Pain in right hip: Secondary | ICD-10-CM
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
## 2020-08-18 ENCOUNTER — Ambulatory Visit
Admission: RE | Admit: 2020-08-18 | Discharge: 2020-08-18 | Disposition: A | Discharge status: Home / Self Care | Payer: Medicaid Other | Source: Ambulatory Visit | Attending: Physician Assistant | Admitting: Physician Assistant

## 2020-08-18 ENCOUNTER — Other Ambulatory Visit: Payer: Self-pay

## 2020-08-18 DIAGNOSIS — M545 Low back pain, unspecified: Secondary | ICD-10-CM | POA: Diagnosis not present

## 2020-08-18 DIAGNOSIS — M5441 Lumbago with sciatica, right side: Secondary | ICD-10-CM | POA: Insufficient documentation

## 2020-08-21 ENCOUNTER — Other Ambulatory Visit: Payer: Self-pay | Admitting: Family Medicine

## 2020-08-21 NOTE — Telephone Encounter (Signed)
Routing to provider  

## 2020-08-24 ENCOUNTER — Other Ambulatory Visit: Payer: Self-pay

## 2020-08-24 ENCOUNTER — Ambulatory Visit: Payer: Medicaid Other

## 2020-08-24 DIAGNOSIS — M25551 Pain in right hip: Secondary | ICD-10-CM | POA: Diagnosis not present

## 2020-08-24 DIAGNOSIS — M546 Pain in thoracic spine: Secondary | ICD-10-CM
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
## 2020-08-29 ENCOUNTER — Ambulatory Visit: Payer: Medicaid Other

## 2020-08-29 ENCOUNTER — Other Ambulatory Visit: Payer: Self-pay

## 2020-08-29 DIAGNOSIS — M25551 Pain in right hip: Secondary | ICD-10-CM

## 2020-08-29 DIAGNOSIS — M546 Pain in thoracic spine: Secondary | ICD-10-CM
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
## 2020-09-04 ENCOUNTER — Encounter: Payer: Self-pay | Admitting: Family Medicine

## 2020-09-04 ENCOUNTER — Other Ambulatory Visit: Payer: Self-pay

## 2020-09-04 ENCOUNTER — Ambulatory Visit (INDEPENDENT_AMBULATORY_CARE_PROVIDER_SITE_OTHER): Payer: Medicaid Other | Admitting: Family Medicine

## 2020-09-04 ENCOUNTER — Ambulatory Visit: Payer: Medicaid Other

## 2020-09-04 VITALS — BP 112/69 | HR 91 | Temp 98.4°F | Wt 250.8 lb

## 2020-09-04 DIAGNOSIS — M4726 Other spondylosis with radiculopathy, lumbar region: Secondary | ICD-10-CM | POA: Diagnosis not present

## 2020-09-04 DIAGNOSIS — Z720 Tobacco use: Secondary | ICD-10-CM | POA: Diagnosis not present

## 2020-09-04 DIAGNOSIS — M25551 Pain in right hip: Secondary | ICD-10-CM | POA: Diagnosis not present

## 2020-09-04 DIAGNOSIS — M546 Pain in thoracic spine: Secondary | ICD-10-CM

## 2020-09-04 MED ORDER — CHANTIX STARTING MONTH PAK 0.5 MG X 11 & 1 MG X 42 PO TABS: ORAL_TABLET | ORAL | 0 refills | Status: DC
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
## 2020-09-04 NOTE — Progress Notes (Signed)
BP 112/69   Pulse 91   Temp 98.4 F (36.9 C)   Wt 250 lb 12.8 oz (113.8 kg)   SpO2 99%   BMI 37.04 kg/m    Subjective:    Patient ID: Jimmy Simmons, male    DOB: 02/23/1961, 59 y.o.   MRN: [id number]  HPI: Jimmy Simmons is a 59 y.o. male  Chief Complaint  Patient presents with  . Leg Pain    Pt here to follow up on leg pain   . Nicotine Dependence    Pt requesting help on quitting smoking   BACK PAIN- doing much better with the PT. Feeling much better.  Duration: 15 weeks Mechanism of injury: unknown Location: bilateral and low back Onset: sudden Severity: moderate Quality: aching and sore Frequency: constant Radiation: R leg Aggravating factors: movement Alleviating factors: physical therapy, steroids, muscle relaxer Status: worse Treatments attempted: rest, ice, heat, APAP, ibuprofen, aleve and physical therapy  Relief with NSAIDs?: moderate Nighttime pain:  no Paresthesias / decreased sensation:  no Bowel / bladder incontinence:  no Fevers:  no Dysuria / urinary frequency:  no  SMOKING CESSATION Smoking Status: current every day smoker Smoking Amount: 1ppd Smoking Onset: 59yo Smoking Quit Date: not set Smoking triggers: drinking Type of tobacco use: cigarettes Children in the house: yes Other household members who smoke: yes Treatments attempted:  Pneumovax: 'UTD  Relevant past medical, surgical, family and social history reviewed and updated as indicated. Interim medical history since our last visit reviewed. Allergies and medications reviewed and updated.  Review of Systems  Constitutional: Negative.   Respiratory: Negative.   Cardiovascular: Negative.   Gastrointestinal: Negative.   Musculoskeletal: Positive for arthralgias, back pain and myalgias. Negative for gait problem, joint swelling, neck pain and neck stiffness.  Skin: Negative.   Neurological: Negative.   Psychiatric/Behavioral: Negative.     Per HPI unless specifically indicated  above     Objective:    BP 112/69   Pulse 91   Temp 98.4 F (36.9 C)   Wt 250 lb 12.8 oz (113.8 kg)   SpO2 99%   BMI 37.04 kg/m   Wt Readings from Last 3 Encounters:  09/04/20 250 lb 12.8 oz (113.8 kg)  08/16/20 247 lb (112 kg)  07/31/20 258 lb 6.4 oz (117.2 kg)    Physical Exam Vitals and nursing note reviewed.  Constitutional:      General: He is not in acute distress.    Appearance: Normal appearance. He is not ill-appearing, toxic-appearing or diaphoretic.  HENT:     Head: Normocephalic and atraumatic.     Right Ear: External ear normal.     Left Ear: External ear normal.     Nose: Nose normal.     Mouth/Throat:     Mouth: Mucous membranes are moist.     Pharynx: Oropharynx is clear.  Eyes:     General: No scleral icterus.       Right eye: No discharge.        Left eye: No discharge.     Extraocular Movements: Extraocular movements intact.     Conjunctiva/sclera: Conjunctivae normal.     Pupils: Pupils are equal, round, and reactive to light.  Cardiovascular:     Rate and Rhythm: Normal rate and regular rhythm.     Pulses: Normal pulses.     Heart sounds: Normal heart sounds. No murmur heard. No friction rub. No gallop.   Pulmonary:     Effort: Pulmonary effort is normal. No  respiratory distress.     Breath sounds: Normal breath sounds. No stridor. No wheezing, rhonchi or rales.  Chest:     Chest wall: No tenderness.  Musculoskeletal:        General: Normal range of motion.     Cervical back: Normal range of motion and neck supple.  Skin:    General: Skin is warm and dry.     Capillary Refill: Capillary refill takes less than 2 seconds.     Coloration: Skin is not jaundiced or pale.     Findings: No bruising, erythema, lesion or rash.  Neurological:     General: No focal deficit present.     Mental Status: He is alert and oriented to person, place, and time. Mental status is at baseline.  Psychiatric:        Mood and Affect: Mood normal.         Behavior: Behavior normal.        Thought Content: Thought content normal.        Judgment: Judgment normal.     Results for orders placed or performed during the hospital encounter of 08/14/20  SARS CORONAVIRUS 2 (TAT 6-24 HRS) Nasopharyngeal Nasopharyngeal Swab   Specimen: Nasopharyngeal Swab  Result Value Ref Range   SARS Coronavirus 2 NEGATIVE NEGATIVE      Assessment & Plan:   Problem List Items Addressed This Visit      Nervous and Auditory   Osteoarthritis of spine with radiculopathy, lumbar region - Primary    Doing much better. Continue with PT. Continue to follow with ortho as needed. Call with any concerns or if not getting better.       Relevant Medications   varenicline (CHANTIX STARTING MONTH PAK) 0.5 MG X 11 & 1 MG X 42 tablet     Other   Tobacco abuse    Will get him started on chantix and recheck 1 month. Call with any concerns. Continue to monitor.           Follow up plan: Return in about 4 weeks (around 10/02/2020) for after starting chantix.

## 2020-09-04 NOTE — Assessment & Plan Note (Signed)
Doing much better. Continue with PT. Continue to follow with ortho as needed. Call with any concerns or if not getting better.

## 2020-09-04 NOTE — Patient Instructions (Signed)
Steps to Quit Smoking Smoking tobacco is the leading cause of preventable death. It can affect almost every organ in the body. Smoking puts you and people around you at risk for many serious, long-lasting (chronic) diseases. Quitting smoking can be hard, but it is one of the best things that you can do for your health. It is never too late to quit. How do I get ready to quit? When you decide to quit smoking, make a plan to help you succeed. Before you quit:  Pick a date to quit. Set a date within the next 2 weeks to give you time to prepare.  Write down the reasons why you are quitting. Keep this list in places where you will see it often.  Tell your family, friends, and co-workers that you are quitting. Their support is important.  Talk with your doctor about the choices that may help you quit.  Find out if your health insurance will pay for these treatments.  Know the people, places, things, and activities that make you want to smoke (triggers). Avoid them. What first steps can I take to quit smoking?  Throw away all cigarettes at home, at work, and in your car.  Throw away the things that you use when you smoke, such as ashtrays and lighters.  Clean your car. Make sure to empty the ashtray.  Clean your home, including curtains and carpets. What can I do to help me quit smoking? Talk with your doctor about taking medicines and seeing a counselor at the same time. You are more likely to succeed when you do both.  If you are pregnant or breastfeeding, talk with your doctor about counseling or other ways to quit smoking. Do not take medicine to help you quit smoking unless your doctor tells you to do so. To quit smoking: Quit right away  Quit smoking totally, instead of slowly cutting back on how much you smoke over a period of time.  Go to counseling. You are more likely to quit if you go to counseling sessions regularly. Take medicine You may take medicines to help you quit. Some  medicines need a prescription, and some you can buy over-the-counter. Some medicines may contain a drug called nicotine to replace the nicotine in cigarettes. Medicines may:  Help you to stop having the desire to smoke (cravings).  Help to stop the problems that come when you stop smoking (withdrawal symptoms). Your doctor may ask you to use:  Nicotine patches, gum, or lozenges.  Nicotine inhalers or sprays.  Non-nicotine medicine that is taken by mouth. Find resources Find resources and other ways to help you quit smoking and remain smoke-free after you quit. These resources are most helpful when you use them often. They include:  Online chats with a counselor.  Phone quitlines.  Printed self-help materials.  Support groups or group counseling.  Text messaging programs.  Mobile phone apps. Use apps on your mobile phone or tablet that can help you stick to your quit plan. There are many free apps for mobile phones and tablets as well as websites. Examples include Quit Guide from the CDC and smokefree.gov  What things can I do to make it easier to quit?   Talk to your family and friends. Ask them to support and encourage you.  Call a phone quitline (1-800-QUIT-NOW), reach out to support groups, or work with a counselor.  Ask people who smoke to not smoke around you.  Avoid places that make you want to smoke,   such as: ? Bars. ? Parties. ? Smoke-break areas at work.  Spend time with people who do not smoke.  Lower the stress in your life. Stress can make you want to smoke. Try these things to help your stress: ? Getting regular exercise. ? Doing deep-breathing exercises. ? Doing yoga. ? Meditating. ? Doing a body scan. To do this, close your eyes, focus on one area of your body at a time from head to toe. Notice which parts of your body are tense. Try to relax the muscles in those areas. How will I feel when I quit smoking? Day 1 to 3 weeks Within the first 24 hours,  you may start to have some problems that come from quitting tobacco. These problems are very bad 2-3 days after you quit, but they do not often last for more than 2-3 weeks. You may get these symptoms:  Mood swings.  Feeling restless, nervous, angry, or annoyed.  Trouble concentrating.  Dizziness.  Strong desire for high-sugar foods and nicotine.  Weight gain.  Trouble pooping (constipation).  Feeling like you may vomit (nausea).  Coughing or a sore throat.  Changes in how the medicines that you take for other issues work in your body.  Depression.  Trouble sleeping (insomnia). Week 3 and afterward After the first 2-3 weeks of quitting, you may start to notice more positive results, such as:  Better sense of smell and taste.  Less coughing and sore throat.  Slower heart rate.  Lower blood pressure.  Clearer skin.  Better breathing.  Fewer sick days. Quitting smoking can be hard. Do not give up if you fail the first time. Some people need to try a few times before they succeed. Do your best to stick to your quit plan, and talk with your doctor if you have any questions or concerns. Summary  Smoking tobacco is the leading cause of preventable death. Quitting smoking can be hard, but it is one of the best things that you can do for your health.  When you decide to quit smoking, make a plan to help you succeed.  Quit smoking right away, not slowly over a period of time.  When you start quitting, seek help from your doctor, family, or friends. This information is not intended to replace advice given to you by your health care provider. Make sure you discuss any questions you have with your health care provider. Document Revised: 05/20/2019 Document Reviewed: 11/13/2018 Elsevier Patient Education  2020 Elsevier Inc.  

## 2020-09-04 NOTE — Assessment & Plan Note (Signed)
Will get him started on chantix and recheck 1 month. Call with any concerns. Continue to monitor.

## 2020-09-06 ENCOUNTER — Ambulatory Visit: Payer: Medicaid Other

## 2020-09-19 DIAGNOSIS — H5213 Myopia, bilateral: Secondary | ICD-10-CM | POA: Diagnosis not present

## 2020-09-27 ENCOUNTER — Other Ambulatory Visit: Payer: Self-pay | Admitting: Family Medicine

## 2020-10-18 ENCOUNTER — Ambulatory Visit: Payer: Medicaid Other | Admitting: Family Medicine

## 2020-10-22 ENCOUNTER — Telehealth: Payer: Self-pay | Admitting: Family Medicine

## 2020-10-22 DIAGNOSIS — B079 Viral wart, unspecified: Secondary | ICD-10-CM

## 2020-10-22 NOTE — Telephone Encounter (Signed)
Copied from [place] [id number]. Topic: Referral - Request for Referral >> Oct 22, 2020  1:56 PM Leward Quan A wrote: Has patient seen PCP for this complaint? Yes.   *If NO, is insurance requiring patient see PCP for this issue before PCP can refer them? Referral for which specialty: Dermatology Preferred provider/office: Dr Evorn Gong at [facility]  Reason for referral: wart on right hand

## 2020-10-23 ENCOUNTER — Other Ambulatory Visit: Payer: Self-pay | Admitting: Family Medicine

## 2020-10-23 NOTE — Telephone Encounter (Signed)
Patient last seen 09/04/20 and has appointment 01/07/21

## 2020-10-23 NOTE — Telephone Encounter (Signed)
Requested medication (s) are due for refill today: no  Requested medication (s) are on the active medication list: yes  Last refill:  10/08/2020  Future visit scheduled: yes  Notes to clinic:  this refill cannot be delegated    Requested Prescriptions  Pending Prescriptions Disp Refills   tiZANidine (ZANAFLEX) 4 MG tablet [Pharmacy Med Name: TIZANIDINE HCL 4 MG TABLET] 90 tablet 1    Sig: TAKE 1 TABLET BY MOUTH EVERY 6 HOURS AS NEEDED FOR MUSCLE SPASMS.      Not Delegated - Cardiovascular:  Alpha-2 Agonists - tizanidine Failed - 10/23/2020  2:03 PM      Failed - This refill cannot be delegated      Passed - Valid encounter within last 6 months    Recent Outpatient Visits           1 month ago Osteoarthritis of spine with radiculopathy, lumbar region   [facility], [place], DO   2 months ago Radicular leg pain   [place], [place], DO   3 months ago Right hip pain   [place], [place], DO   9 months ago RUQ pain   [place], DO   1 year ago Routine general medical examination at a health care facility   [place], Barb Merino, DO       Future Appointments             In 2 months [name], Barb Merino, DO [facility]

## 2020-10-23 NOTE — Telephone Encounter (Signed)
Can you sent this to the clinical team. Thank you

## 2020-10-29 ENCOUNTER — Telehealth: Payer: Self-pay

## 2020-10-29 NOTE — Telephone Encounter (Signed)
Copied from [place] [phone]. Topic: Referral - Status >> Oct 29, 2020 10:49 AM Erick Blinks wrote: Reason for CRM: Raquel Sarna from [facility] called to speak to referrals, please advise.  Best contact: [phone] ext [phone]

## 2020-10-30 NOTE — Telephone Encounter (Signed)
[facility] and let them know I filled the form out that they needed. Left my call back number with any issues.

## 2020-12-07 ENCOUNTER — Telehealth: Payer: Self-pay

## 2020-12-07 NOTE — Telephone Encounter (Signed)
Copied from [place] [phone]. Topic: General - Other >> Dec 07, 2020 12:33 PM [name], [place] C wrote: Reason for CRM: [name] with [facility] is calling in to speak with the referrals area. Received pt's referral and would like to discuss further.   [phone]

## 2020-12-10 DIAGNOSIS — D485 Neoplasm of uncertain behavior of skin: Secondary | ICD-10-CM | POA: Diagnosis not present

## 2020-12-10 DIAGNOSIS — D2261 Melanocytic nevi of right upper limb, including shoulder: Secondary | ICD-10-CM | POA: Diagnosis not present

## 2020-12-10 DIAGNOSIS — R208 Other disturbances of skin sensation: Secondary | ICD-10-CM | POA: Diagnosis not present

## 2020-12-10 DIAGNOSIS — D2272 Melanocytic nevi of left lower limb, including hip: Secondary | ICD-10-CM | POA: Diagnosis not present

## 2020-12-10 DIAGNOSIS — D2262 Melanocytic nevi of left upper limb, including shoulder: Secondary | ICD-10-CM | POA: Diagnosis not present

## 2020-12-10 DIAGNOSIS — R238 Other skin changes: Secondary | ICD-10-CM | POA: Diagnosis not present

## 2020-12-10 DIAGNOSIS — L309 Dermatitis, unspecified: Secondary | ICD-10-CM | POA: Diagnosis not present

## 2020-12-10 DIAGNOSIS — B078 Other viral warts: Secondary | ICD-10-CM | POA: Diagnosis not present

## 2020-12-10 DIAGNOSIS — Z85828 Personal history of other malignant neoplasm of skin: Secondary | ICD-10-CM | POA: Diagnosis not present

## 2020-12-10 DIAGNOSIS — L570 Actinic keratosis: Secondary | ICD-10-CM | POA: Diagnosis not present

## 2021-01-07 ENCOUNTER — Ambulatory Visit: Payer: Medicaid Other | Admitting: Family Medicine

## 2021-01-08 DIAGNOSIS — L400 Psoriasis vulgaris: Secondary | ICD-10-CM | POA: Diagnosis not present

## 2021-01-08 DIAGNOSIS — C44519 Basal cell carcinoma of skin of other part of trunk: Secondary | ICD-10-CM | POA: Diagnosis not present

## 2021-02-26 ENCOUNTER — Encounter: Payer: Self-pay | Admitting: Family Medicine

## 2021-02-26 ENCOUNTER — Ambulatory Visit (INDEPENDENT_AMBULATORY_CARE_PROVIDER_SITE_OTHER): Payer: Medicaid Other | Admitting: Family Medicine

## 2021-02-26 ENCOUNTER — Other Ambulatory Visit: Payer: Self-pay

## 2021-02-26 VITALS — BP 116/76 | HR 88 | Temp 98.2°F | Wt 251.0 lb

## 2021-02-26 DIAGNOSIS — J449 Chronic obstructive pulmonary disease, unspecified: Secondary | ICD-10-CM | POA: Diagnosis not present

## 2021-02-26 DIAGNOSIS — I2510 Atherosclerotic heart disease of native coronary artery without angina pectoris: Secondary | ICD-10-CM

## 2021-02-26 DIAGNOSIS — B182 Chronic viral hepatitis C: Secondary | ICD-10-CM | POA: Diagnosis not present

## 2021-02-26 DIAGNOSIS — F3341 Major depressive disorder, recurrent, in partial remission: Secondary | ICD-10-CM

## 2021-02-26 DIAGNOSIS — R1011 Right upper quadrant pain: Secondary | ICD-10-CM | POA: Diagnosis not present

## 2021-02-26 DIAGNOSIS — Z1211 Encounter for screening for malignant neoplasm of colon: Secondary | ICD-10-CM

## 2021-02-26 DIAGNOSIS — F419 Anxiety disorder, unspecified: Secondary | ICD-10-CM | POA: Diagnosis not present

## 2021-02-26 DIAGNOSIS — I10 Essential (primary) hypertension: Secondary | ICD-10-CM | POA: Diagnosis not present

## 2021-02-26 LAB — MICROALBUMIN, URINE WAIVED
Creatinine, Urine Waived: 200 mg/dL (ref 10–300)
Microalb, Ur Waived: 10 mg/L (ref 0–19)
Microalb/Creat Ratio: <30 mg/g (ref <30)

## 2021-02-26 MED ORDER — ALBUTEROL SULFATE HFA 108 (90 BASE) MCG/ACT IN AERS: 2.0000 | INHALATION_SPRAY | Freq: Four times a day (QID) | RESPIRATORY_TRACT | 12 refills | Status: DC | PRN

## 2021-02-26 MED ORDER — LISINOPRIL-HYDROCHLOROTHIAZIDE 20-25 MG PO TABS: 1.0000 | ORAL_TABLET | Freq: Every day | ORAL | 1 refills | Status: DC

## 2021-02-26 NOTE — Progress Notes (Addendum)
BP 116/76   Pulse 88   Temp 98.2 F (36.8 C)   Wt 251 lb (113.9 kg)   SpO2 98%   BMI 37.07 kg/m    Subjective:    Patient ID: Jimmy Simmons, male    DOB: 1960/09/21, 60 y.o.   MRN: [id number]  HPI: Jimmy Simmons is a 60 y.o. male  Chief Complaint  Patient presents with   Flank Pain    Patient states his side is hurting where his liver is, pain comes and goes for about a month    Nicotine Dependence    Follow up, patient did not start chantix    Hypertension   COPD   Depression   ABDOMINAL PAIN  Duration:about a month Onset: gradual Severity: mild Quality: soreness Location:  RUQ  Episode duration:  Radiation: no Frequency: intermittent Alleviating factors:  Aggravating factors: drinking alcohol Status: stable Treatments attempted: cutting back on alcohol Fever: no Nausea: no Vomiting: no Weight loss: no Decreased appetite: no Diarrhea: yes- with alcohol Constipation: no Blood in stool: no Heartburn: no Jaundice: no Rash: no Dysuria/urinary frequency: no Hematuria: no History of sexually transmitted disease: no Recurrent NSAID use: no  DEPRESSION Mood status: controlled Satisfied with current treatment?: yes Symptom severity: mild  Duration of current treatment : not on anything Depressed mood: no Anxious mood: no Anhedonia: no Significant weight loss or gain: no Insomnia: no hard to stay asleep Fatigue: no Feelings of worthlessness or guilt: no Impaired concentration/indecisiveness: no Suicidal ideations: no Hopelessness: no Crying spells: no Depression screen [facility] 2/9 02/26/2021 07/10/2020 11/12/2018 05/03/2018 01/11/2018  Decreased Interest 0 0 0 0 0  Down, Depressed, Hopeless 0 0 0 0 0  PHQ - 2 Score 0 0 0 0 0  Altered sleeping 0 0 1 2 1   Tired, decreased energy 0 0 1 2 0  Change in appetite 0 0 0 0 0  Feeling bad or failure about yourself  0 0 0 0 0  Trouble concentrating 0 1 0 0 0  Moving slowly or fidgety/restless 0 0 0 0 0  Suicidal thoughts  0 0 0 0 0  PHQ-9 Score 0 1 2 4 1   Difficult doing work/chores - Not difficult at all - Not difficult at all Not difficult at all   HYPERTENSION Hypertension status: controlled  Satisfied with current treatment? yes Duration of hypertension: chronic BP monitoring frequency:  not checking BP medication side effects:  no Medication compliance: excellent compliance Previous BP meds:lisinopril-HCTZ Aspirin: no Recurrent headaches: no Visual changes: no Palpitations: no Dyspnea: no Chest pain: no Lower extremity edema: no Dizzy/lightheaded: no  COPD COPD status: controlled Satisfied with current treatment?: yes Oxygen use: no Dyspnea frequency: rarely Cough frequency: rarely Rescue inhaler frequency: rarely   Limitation of activity: no Productive cough: no Pneumovax: Up to Date Influenza: Not up to Date   Relevant past medical, surgical, family and social history reviewed and updated as indicated. Interim medical history since our last visit reviewed. Allergies and medications reviewed and updated.  Review of Systems  Constitutional: Negative.   Respiratory: Negative.    Cardiovascular: Negative.   Gastrointestinal:  Positive for abdominal pain and diarrhea. Negative for abdominal distention, anal bleeding, blood in stool, constipation, nausea, rectal pain and vomiting.  Genitourinary: Negative.   Musculoskeletal: Negative.   Skin: Negative.   Neurological: Negative.   Psychiatric/Behavioral: Negative.     Per HPI unless specifically indicated above     Objective:    BP 116/76   Pulse  88   Temp 98.2 F (36.8 C)   Wt 251 lb (113.9 kg)   SpO2 98%   BMI 37.07 kg/m   Wt Readings from Last 3 Encounters:  02/26/21 251 lb (113.9 kg)  09/04/20 250 lb 12.8 oz (113.8 kg)  08/16/20 247 lb (112 kg)    Physical Exam Vitals and nursing note reviewed.  Constitutional:      General: He is not in acute distress.    Appearance: Normal appearance. He is not ill-appearing,  toxic-appearing or diaphoretic.  HENT:     Head: Normocephalic and atraumatic.     Right Ear: External ear normal.     Left Ear: External ear normal.     Nose: Nose normal.     Mouth/Throat:     Mouth: Mucous membranes are moist.     Pharynx: Oropharynx is clear.  Eyes:     General: No scleral icterus.       Right eye: No discharge.        Left eye: No discharge.     Extraocular Movements: Extraocular movements intact.     Conjunctiva/sclera: Conjunctivae normal.     Pupils: Pupils are equal, round, and reactive to light.  Cardiovascular:     Rate and Rhythm: Normal rate and regular rhythm.     Pulses: Normal pulses.     Heart sounds: Normal heart sounds. No murmur heard.   No friction rub. No gallop.  Pulmonary:     Effort: Pulmonary effort is normal. No respiratory distress.     Breath sounds: Normal breath sounds. No stridor. No wheezing, rhonchi or rales.  Chest:     Chest wall: No tenderness.  Musculoskeletal:        General: Normal range of motion.     Cervical back: Normal range of motion and neck supple.  Skin:    General: Skin is warm and dry.     Capillary Refill: Capillary refill takes less than 2 seconds.     Coloration: Skin is not jaundiced or pale.     Findings: No bruising, erythema, lesion or rash.  Neurological:     General: No focal deficit present.     Mental Status: He is alert and oriented to person, place, and time. Mental status is at baseline.  Psychiatric:        Mood and Affect: Mood normal.        Behavior: Behavior normal.        Thought Content: Thought content normal.        Judgment: Judgment normal.    Results for orders placed or performed during the hospital encounter of 08/14/20  SARS CORONAVIRUS 2 (TAT 6-24 HRS) Nasopharyngeal Nasopharyngeal Swab   Specimen: Nasopharyngeal Swab  Result Value Ref Range   SARS Coronavirus 2 NEGATIVE NEGATIVE      Assessment & Plan:   Problem List Items Addressed This Visit       Cardiovascular  and Mediastinum   HTN (hypertension)    Under good control on current regimen. Continue current regimen. Continue to monitor. Call with any concerns. Refills given. Labs drawn today.        Relevant Medications   lisinopril-hydrochlorothiazide (ZESTORETIC) 20-25 MG tablet   Other Relevant Orders   CBC with Differential/Platelet   Comprehensive metabolic panel   Microalbumin, Urine Waived   CAD (coronary artery disease)    Will keep BP and cholesterol under good control. Continue to monitor. Call with any concerns.  Relevant Medications   lisinopril-hydrochlorothiazide (ZESTORETIC) 20-25 MG tablet   Other Relevant Orders   CBC with Differential/Platelet   Comprehensive metabolic panel   Lipid Panel w/o Chol/HDL Ratio     Respiratory   COPD (chronic obstructive pulmonary disease) (HCC)   Relevant Medications   albuterol (VENTOLIN HFA) 108 (90 Base) MCG/ACT inhaler   Other Relevant Orders   CBC with Differential/Platelet   Comprehensive metabolic panel     Digestive   Hepatitis C    S/p treatment- rechecking labs today. Await results. Treat as needed.          Other   Anxiety    Under good control off medicine. Continue to monitor. Call with any concerns.        Relevant Orders   CBC with Differential/Platelet   Comprehensive metabolic panel   Depression    Under good control off medicine. Continue to monitor. Call with any concerns.        Relevant Orders   CBC with Differential/Platelet   Comprehensive metabolic panel   Other Visit Diagnoses     RUQ pain    -  Primary   Will check labs and RUQ [place]. Await results. Treat as needed.    Relevant Orders   US Abdomen Limited RUQ (LIVER/GB)   Screening for colon cancer       Due for colonoscopy- referral sent today.   Relevant Orders   Ambulatory referral to Gastroenterology        Follow up plan: Return in about 6 months (around 08/28/2021) for physical.

## 2021-02-26 NOTE — Assessment & Plan Note (Signed)
S/p treatment- rechecking labs today. Await results. Treat as needed.

## 2021-02-26 NOTE — Assessment & Plan Note (Signed)
Under good control off medicine. Continue to monitor. Call with any concerns.

## 2021-02-26 NOTE — Assessment & Plan Note (Signed)
Under good control on current regimen. Continue current regimen. Continue to monitor. Call with any concerns. Refills given. Labs drawn today.   

## 2021-02-26 NOTE — Assessment & Plan Note (Signed)
Will keep BP and cholesterol under good control. Continue to monitor. Call with any concerns.  

## 2021-02-27 LAB — CBC WITH DIFFERENTIAL/PLATELET
Basophils Absolute: 0.1 10*3/uL (ref 0.0–0.2)
Basos: 1 %
EOS (ABSOLUTE): 0.2 10*3/uL (ref 0.0–0.4)
Eos: 2 %
Hematocrit: 41.7 % (ref 37.5–51.0)
Hemoglobin: 14.5 g/dL (ref 13.0–17.7)
Immature Grans (Abs): 0 10*3/uL (ref 0.0–0.1)
Immature Granulocytes: 0 %
Lymphocytes Absolute: 2.6 10*3/uL (ref 0.7–3.1)
Lymphs: 32 %
MCH: 33.3 pg — ABNORMAL HIGH (ref 26.6–33.0)
MCHC: 34.8 g/dL (ref 31.5–35.7)
MCV: 96 fL (ref 79–97)
Monocytes Absolute: 0.6 10*3/uL (ref 0.1–0.9)
Monocytes: 8 %
Neutrophils Absolute: 4.7 10*3/uL (ref 1.4–7.0)
Neutrophils: 57 %
Platelets: 199 10*3/uL (ref 150–450)
RBC: 4.35 x10E6/uL (ref 4.14–5.80)
RDW: 12.1 % (ref 11.6–15.4)
WBC: 8.3 10*3/uL (ref 3.4–10.8)

## 2021-02-27 LAB — LIPID PANEL W/O CHOL/HDL RATIO
Cholesterol, Total: 162 mg/dL (ref 100–199)
HDL: 44 mg/dL (ref >39)
LDL Chol Calc (NIH): 99 mg/dL (ref 0–99)
Triglycerides: 106 mg/dL (ref 0–149)
VLDL Cholesterol Cal: 19 mg/dL (ref 5–40)

## 2021-02-27 LAB — COMPREHENSIVE METABOLIC PANEL
ALT: 26 IU/L (ref 0–44)
AST: 19 IU/L (ref 0–40)
Albumin/Globulin Ratio: 1.8 (ref 1.2–2.2)
Albumin: 4.6 g/dL (ref 3.8–4.9)
Alkaline Phosphatase: 61 IU/L (ref 44–121)
BUN/Creatinine Ratio: 18 (ref 9–20)
BUN: 20 mg/dL (ref 6–24)
Bilirubin Total: 0.4 mg/dL (ref 0.0–1.2)
CO2: 20 mmol/L (ref 20–29)
Calcium: 9.9 mg/dL (ref 8.7–10.2)
Chloride: 96 mmol/L (ref 96–106)
Creatinine, Ser: 1.1 mg/dL (ref 0.76–1.27)
Globulin, Total: 2.5 g/dL (ref 1.5–4.5)
Glucose: 121 mg/dL — ABNORMAL HIGH (ref 65–99)
Potassium: 3.9 mmol/L (ref 3.5–5.2)
Sodium: 135 mmol/L (ref 134–144)
Total Protein: 7.1 g/dL (ref 6.0–8.5)
eGFR: 78 mL/min/{1.73_m2} (ref >59)

## 2021-02-28 ENCOUNTER — Encounter: Payer: Self-pay | Admitting: Family Medicine

## 2021-03-06 ENCOUNTER — Telehealth: Payer: Self-pay

## 2021-03-06 ENCOUNTER — Encounter: Payer: Self-pay | Admitting: *Deleted

## 2021-03-06 NOTE — Telephone Encounter (Signed)
Copied from [place] [id number]. Topic: General - Other >> Mar 06, 2021  2:08 PM Pawlus, Brayton Layman A wrote: Reason for CRM: Pt wanted a call back, stated he does not know why a referral was sent in for a colon cancer screening when he said he was supposed to be given a referral to get his liver checked. Pt wanted an update on what is going on, Please advise.

## 2021-03-06 NOTE — Telephone Encounter (Signed)
Called patient, no further questions.

## 2021-03-19 ENCOUNTER — Telehealth: Payer: Self-pay | Admitting: Gastroenterology

## 2021-03-19 ENCOUNTER — Telehealth: Payer: Self-pay

## 2021-03-19 NOTE — Telephone Encounter (Signed)
Could you please reschedule patient for another day? Patient states he will be out of town at the time of the current date.

## 2021-03-19 NOTE — Telephone Encounter (Signed)
Copied from [place] [id number]. Topic: Appointment Scheduling - Scheduling Inquiry for Clinic >> Mar 19, 2021 11:58 AM Yvette Rack wrote: Reason for CRM: Pt stated he spoke with Dr. Winnifred Friar office and he was told no changes could be made to his appt for 03/28/21 for [place] because it was ordered by Dr. [name]. Pt stated he will not able to make the appt and requested that the appt be cancelled. Pt would like to reschedule the appt for 1 month out. Cb# [phone]

## 2021-03-19 NOTE — Telephone Encounter (Signed)
Patient wants to cancel his [place] scheduled on 03/28/21.  Please call patient

## 2021-03-19 NOTE — Telephone Encounter (Signed)
Left vm informing pt he would need to call Dr. Durenda Age office (PCP) or central scheduling to cancel as we did not order this imaging.

## 2021-03-19 NOTE — Telephone Encounter (Signed)
He has 2 things going on. He needs his colonoscopy which is why he was referred to GI, but he also has an order in for a RUQ [place]. 2 different things.

## 2021-03-28 ENCOUNTER — Ambulatory Visit: Payer: Medicaid Other | Attending: Family Medicine

## 2021-06-18 ENCOUNTER — Other Ambulatory Visit: Payer: Self-pay | Admitting: Family Medicine

## 2021-06-19 NOTE — Telephone Encounter (Signed)
.   Requested Prescriptions  Pending Prescriptions Disp Refills  . fluticasone-salmeterol (ADVAIR DISKUS) 250-50 MCG/ACT AEPB [Pharmacy Med Name: ADVAIR 250-50 KNZUDO] 725 each 1    Sig: TAKE 1 PUFF BY MOUTH TWICE A DAY     Pulmonology:  [employer] Passed - 06/18/2021 11:32 PM      Passed - Valid encounter within last 12 months    Recent Outpatient Visits          3 months ago RUQ pain   [place], Megan P, DO   9 months ago Osteoarthritis of spine with radiculopathy, lumbar region   [facility], Megan P, DO   10 months ago Radicular leg pain   [place], DO   11 months ago Right hip pain   [place], DO   1 year ago RUQ pain   [place], [place], DO      Future Appointments            In 2 months [name], Barb Merino, DO [facility]

## 2021-07-28 ENCOUNTER — Other Ambulatory Visit: Payer: Self-pay | Admitting: Family Medicine

## 2021-07-28 NOTE — Telephone Encounter (Signed)
Requested Prescriptions  Pending Prescriptions Disp Refills  . ibuprofen (ADVIL) 600 MG tablet [Pharmacy Med Name: IBUPROFEN 600 MG TABLET] 90 tablet 0    Sig: TAKE 1 TABLET BY MOUTH EVERY 8 HOURS AS NEEDED     Analgesics:  NSAIDS Passed - 07/28/2021 12:45 AM      Passed - Cr in normal range and within 360 days    Creatinine, Ser  Date Value Ref Range Status  02/26/2021 1.10 0.76 - 1.27 mg/dL Final         Passed - HGB in normal range and within 360 days    Hemoglobin  Date Value Ref Range Status  02/26/2021 14.5 13.0 - 17.7 g/dL Final         Passed - Patient is not pregnant      Passed - Valid encounter within last 12 months    Recent Outpatient Visits          5 months ago RUQ pain   [place], Megan P, DO   10 months ago Osteoarthritis of spine with radiculopathy, lumbar region   [facility], [place], DO   12 months ago Radicular leg pain   [place], DO   1 year ago Right hip pain   [place], DO   1 year ago RUQ pain   [place], [place], DO      Future Appointments            In 1 month [place], Barb Merino, DO [facility]

## 2021-08-23 ENCOUNTER — Other Ambulatory Visit: Payer: Self-pay | Admitting: Family Medicine

## 2021-08-24 NOTE — Telephone Encounter (Signed)
Requested medication (s) are due for refill today: yes  Requested medication (s) are on the active medication list: yes  Last refill:  06/27/21  Future visit scheduled: 08/29/21  Notes to clinic:  This medication can not be delegated, please assess.   Requested Prescriptions  Pending Prescriptions Disp Refills   tiZANidine (ZANAFLEX) 4 MG tablet [Pharmacy Med Name: TIZANIDINE HCL 4 MG TABLET] 90 tablet 1    Sig: TAKE 1 TABLET BY MOUTH EVERY 6 HOURS AS NEEDED FOR MUSCLE SPASMS.     Not Delegated - Cardiovascular:  Alpha-2 Agonists - tizanidine Failed - 08/23/2021  1:52 PM      Failed - This refill cannot be delegated      Passed - Valid encounter within last 6 months    Recent Outpatient Visits           5 months ago RUQ pain   [place], Megan P, DO   11 months ago Osteoarthritis of spine with radiculopathy, lumbar region   [facility], [place], DO   1 year ago Radicular leg pain   [place], [place], DO   1 year ago Right hip pain   [place], DO   1 year ago RUQ pain   [place], [place], DO       Future Appointments             In 5 days [name], Barb Merino, DO [facility]

## 2021-08-29 ENCOUNTER — Encounter: Payer: Self-pay | Admitting: Family Medicine

## 2021-08-29 ENCOUNTER — Other Ambulatory Visit: Payer: Self-pay

## 2021-08-29 ENCOUNTER — Ambulatory Visit (INDEPENDENT_AMBULATORY_CARE_PROVIDER_SITE_OTHER): Payer: Medicaid Other | Admitting: Family Medicine

## 2021-08-29 VITALS — BP 123/80 | HR 96 | Temp 98.1°F | Ht 68.0 in | Wt 256.0 lb

## 2021-08-29 DIAGNOSIS — Z1211 Encounter for screening for malignant neoplasm of colon: Secondary | ICD-10-CM | POA: Diagnosis not present

## 2021-08-29 DIAGNOSIS — Z23 Encounter for immunization: Secondary | ICD-10-CM

## 2021-08-29 DIAGNOSIS — Z0000 Encounter for general adult medical examination without abnormal findings: Secondary | ICD-10-CM | POA: Diagnosis not present

## 2021-08-29 DIAGNOSIS — Z136 Encounter for screening for cardiovascular disorders: Secondary | ICD-10-CM | POA: Diagnosis not present

## 2021-08-29 LAB — MICROALBUMIN, URINE WAIVED
Creatinine, Urine Waived: 50 mg/dL (ref 10–300)
Microalb, Ur Waived: 10 mg/L (ref 0–19)
Microalb/Creat Ratio: <30 mg/g (ref <30)

## 2021-08-29 LAB — URINALYSIS, ROUTINE W REFLEX MICROSCOPIC
Bilirubin, UA: NEGATIVE
Glucose, UA: NEGATIVE
Ketones, UA: NEGATIVE
Nitrite, UA: NEGATIVE
Protein,UA: NEGATIVE
RBC, UA: NEGATIVE
Specific Gravity, UA: 1.02 (ref 1.005–1.030)
Urobilinogen, Ur: 0.2 mg/dL (ref 0.2–1.0)
pH, UA: 7 (ref 5.0–7.5)

## 2021-08-29 LAB — MICROSCOPIC EXAMINATION
Bacteria, UA: NONE SEEN
RBC, Urine: NONE SEEN /hpf (ref 0–2)

## 2021-08-29 LAB — BAYER DCA HB A1C WAIVED: HB A1C (BAYER DCA - WAIVED): 6.9 % — ABNORMAL HIGH (ref 4.8–5.6)

## 2021-08-29 MED ORDER — FLUTICASONE-SALMETEROL 250-50 MCG/ACT IN AEPB: INHALATION_SPRAY | RESPIRATORY_TRACT | 1 refills | Status: DC

## 2021-08-29 MED ORDER — LISINOPRIL-HYDROCHLOROTHIAZIDE 20-25 MG PO TABS: 1.0000 | ORAL_TABLET | Freq: Every day | ORAL | 1 refills | Status: DC
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
## 2021-08-30 ENCOUNTER — Encounter: Payer: Self-pay | Admitting: Family Medicine

## 2021-08-30 LAB — CBC WITH DIFFERENTIAL/PLATELET
Basophils Absolute: 0.1 10*3/uL (ref 0.0–0.2)
Basos: 1 %
EOS (ABSOLUTE): 0.2 10*3/uL (ref 0.0–0.4)
Eos: 3 %
Hematocrit: 43.5 % (ref 37.5–51.0)
Hemoglobin: 15.2 g/dL (ref 13.0–17.7)
Immature Grans (Abs): 0 10*3/uL (ref 0.0–0.1)
Immature Granulocytes: 1 %
Lymphocytes Absolute: 2.6 10*3/uL (ref 0.7–3.1)
Lymphs: 30 %
MCH: 33.4 pg — ABNORMAL HIGH (ref 26.6–33.0)
MCHC: 34.9 g/dL (ref 31.5–35.7)
MCV: 96 fL (ref 79–97)
Monocytes Absolute: 0.8 10*3/uL (ref 0.1–0.9)
Monocytes: 9 %
Neutrophils Absolute: 5 10*3/uL (ref 1.4–7.0)
Neutrophils: 56 %
Platelets: 195 10*3/uL (ref 150–450)
RBC: 4.55 x10E6/uL (ref 4.14–5.80)
RDW: 12.4 % (ref 11.6–15.4)
WBC: 8.8 10*3/uL (ref 3.4–10.8)

## 2021-08-30 LAB — COMPREHENSIVE METABOLIC PANEL
ALT: 28 IU/L (ref 0–44)
AST: 21 IU/L (ref 0–40)
Albumin/Globulin Ratio: 1.6 (ref 1.2–2.2)
Albumin: 4.6 g/dL (ref 3.8–4.9)
Alkaline Phosphatase: 69 IU/L (ref 44–121)
BUN/Creatinine Ratio: 13 (ref 9–20)
BUN: 12 mg/dL (ref 6–24)
Bilirubin Total: 0.3 mg/dL (ref 0.0–1.2)
CO2: 22 mmol/L (ref 20–29)
Calcium: 9.7 mg/dL (ref 8.7–10.2)
Chloride: 95 mmol/L — ABNORMAL LOW (ref 96–106)
Creatinine, Ser: 0.96 mg/dL (ref 0.76–1.27)
Globulin, Total: 2.8 g/dL (ref 1.5–4.5)
Glucose: 131 mg/dL — ABNORMAL HIGH (ref 70–99)
Potassium: 4.1 mmol/L (ref 3.5–5.2)
Sodium: 135 mmol/L (ref 134–144)
Total Protein: 7.4 g/dL (ref 6.0–8.5)
eGFR: 91 mL/min/{1.73_m2} (ref >59)

## 2021-08-30 LAB — LIPID PANEL W/O CHOL/HDL RATIO
Cholesterol, Total: 163 mg/dL (ref 100–199)
HDL: 45 mg/dL (ref >39)
LDL Chol Calc (NIH): 89 mg/dL (ref 0–99)
Triglycerides: 170 mg/dL — ABNORMAL HIGH (ref 0–149)
VLDL Cholesterol Cal: 29 mg/dL (ref 5–40)

## 2021-08-30 LAB — PSA: Prostate Specific Ag, Serum: 1 ng/mL (ref 0.0–4.0)

## 2021-08-30 LAB — TSH: TSH: 2.7 u[IU]/mL (ref 0.450–4.500)

## 2021-09-10 ENCOUNTER — Other Ambulatory Visit: Payer: Self-pay

## 2021-09-10 DIAGNOSIS — Z86010 Personal history of colonic polyps: Secondary | ICD-10-CM

## 2021-09-10 MED ORDER — PEG 3350-KCL-NA BICARB-NACL 420 G PO SOLR: 4000.0000 mL | Freq: Once | ORAL | 0 refills | Status: AC

## 2021-09-10 NOTE — Progress Notes (Signed)
Gastroenterology Pre-Procedure Review  Request Date: 09/19/2021 Requesting Physician: Dr. Allen Norris  PATIENT REVIEW QUESTIONS: The patient responded to the following health history questions as indicated:  5 year recall  1. Are you having any GI issues? no 2. Do you have a personal history of Polyps? yes (11/2015 polyps removed.) 3. Do you have a family history of Colon Cancer or Polyps? no 4. Diabetes Mellitus? no 5. Joint replacements in the past 12 months?no 6. Major health problems in the past 3 months?no 7. Any artificial heart valves, MVP, or defibrillator?no    MEDICATIONS & ALLERGIES:    Patient reports the following regarding taking any anticoagulation/antiplatelet therapy:   Plavix, Coumadin, Eliquis, Xarelto, Lovenox, Pradaxa, Brilinta, or Effient? no Aspirin? no  Patient confirms/reports the following medications:  Current Outpatient Medications  Medication Sig Dispense Refill   albuterol (VENTOLIN HFA) 108 (90 Base) MCG/ACT inhaler Inhale 2 puffs into the lungs every 6 (six) hours as needed for wheezing or shortness of breath. 1 each 12   fluticasone-salmeterol (ADVAIR DISKUS) 250-50 MCG/ACT AEPB TAKE 1 PUFF BY MOUTH TWICE A DAY 180 each 1   ibuprofen (ADVIL) 600 MG tablet TAKE 1 TABLET BY MOUTH EVERY 8 HOURS AS NEEDED 90 tablet 0   ketoconazole (NIZORAL) 2 % shampoo Apply topically.     lisinopril-hydrochlorothiazide (ZESTORETIC) 20-25 MG tablet Take 1 tablet by mouth daily. 90 tablet 1   tiZANidine (ZANAFLEX) 4 MG tablet TAKE 1 TABLET BY MOUTH EVERY 6 HOURS AS NEEDED FOR MUSCLE SPASMS. 90 tablet 1   triamcinolone ointment (KENALOG) 0.1 % SMARTSIG:Sparingly Topical Twice Daily     No current facility-administered medications for this visit.    Patient confirms/reports the following allergies:  No Known Allergies  No orders of the defined types were placed in this encounter.   AUTHORIZATION INFORMATION Primary Insurance: 1D#: Group #:  Secondary  Insurance: 1D#: Group #:  SCHEDULE INFORMATION: Date: 09/19/2021 Time: Location: [place]

## 2021-09-11 ENCOUNTER — Encounter: Payer: Self-pay | Admitting: Gastroenterology

## 2021-09-19 ENCOUNTER — Encounter: Payer: Self-pay | Admitting: Gastroenterology

## 2021-09-19 ENCOUNTER — Ambulatory Visit: Payer: Medicaid Other | Admitting: Anesthesiology

## 2021-09-19 ENCOUNTER — Ambulatory Visit
Admission: RE | Admit: 2021-09-19 | Discharge: 2021-09-19 | Disposition: A | Discharge status: Home / Self Care | Payer: Medicaid Other | Attending: Gastroenterology | Admitting: Gastroenterology

## 2021-09-19 ENCOUNTER — Other Ambulatory Visit: Payer: Self-pay

## 2021-09-19 ENCOUNTER — Encounter
Admission: RE | Disposition: A | Discharge status: Home / Self Care | Payer: Self-pay | Source: Home / Self Care | Attending: Gastroenterology

## 2021-09-19 DIAGNOSIS — K648 Other hemorrhoids: Secondary | ICD-10-CM | POA: Diagnosis not present

## 2021-09-19 DIAGNOSIS — K635 Polyp of colon: Secondary | ICD-10-CM

## 2021-09-19 DIAGNOSIS — D123 Benign neoplasm of transverse colon: Secondary | ICD-10-CM | POA: Insufficient documentation

## 2021-09-19 DIAGNOSIS — Z860100 Personal history of colon polyps, unspecified: Secondary | ICD-10-CM

## 2021-09-19 DIAGNOSIS — K621 Rectal polyp: Secondary | ICD-10-CM | POA: Diagnosis not present

## 2021-09-19 DIAGNOSIS — Z1211 Encounter for screening for malignant neoplasm of colon: Secondary | ICD-10-CM | POA: Insufficient documentation

## 2021-09-19 HISTORY — DX: Psoriasis, unspecified: L40.9

## 2021-09-19 HISTORY — DX: Unspecified convulsions: R56.9

## 2021-09-19 HISTORY — PX: COLONOSCOPY WITH PROPOFOL: SHX5780

## 2021-09-19 HISTORY — PX: POLYPECTOMY: SHX5525

## 2021-09-19 SURGERY — COLONOSCOPY WITH PROPOFOL
Anesthesia: General | Site: Rectum

## 2021-09-19 MED ORDER — SODIUM CHLORIDE 0.9 % IV SOLN: INTRAVENOUS | Status: DC

## 2021-09-19 MED ORDER — ACETAMINOPHEN 160 MG/5ML PO SOLN: 325.0000 mg | ORAL | Status: DC | PRN

## 2021-09-19 MED ORDER — LACTATED RINGERS IV SOLN: INTRAVENOUS | Status: DC

## 2021-09-19 MED ORDER — ONDANSETRON HCL 4 MG/2ML IJ SOLN: 4.0000 mg | Freq: Once | INTRAMUSCULAR | Status: DC | PRN

## 2021-09-19 MED ORDER — STERILE WATER FOR IRRIGATION IR SOLN
Status: DC | PRN
  Administered 2021-09-19: 250 mL

## 2021-09-19 MED ORDER — PROPOFOL 10 MG/ML IV BOLUS
INTRAVENOUS | Status: DC | PRN
  Administered 2021-09-19 (×2): 40 mg via INTRAVENOUS
  Administered 2021-09-19: 100 mg via INTRAVENOUS
  Administered 2021-09-19: 20 mg via INTRAVENOUS
  Administered 2021-09-19: 40 mg via INTRAVENOUS

## 2021-09-19 MED ORDER — ACETAMINOPHEN 325 MG PO TABS: 325.0000 mg | ORAL_TABLET | ORAL | Status: DC | PRN

## 2021-09-19 MED ORDER — LIDOCAINE HCL (CARDIAC) PF 100 MG/5ML IV SOSY
PREFILLED_SYRINGE | INTRAVENOUS | Status: DC | PRN
  Administered 2021-09-19: 30 mg via INTRAVENOUS

## 2021-09-19 SURGICAL SUPPLY — 9 items
FORCEPS BIOP RAD 4 LRG CAP 4 (CUTTING FORCEPS) ×1 IMPLANT
GOWN CVR UNV OPN BCK APRN NK (MISCELLANEOUS) ×4 IMPLANT
GOWN ISOL THUMB LOOP REG UNIV (MISCELLANEOUS) ×6
KIT PRC NS LF DISP ENDO (KITS) ×2 IMPLANT
KIT PROCEDURE OLYMPUS (KITS) ×3
MANIFOLD NEPTUNE II (INSTRUMENTS) ×3 IMPLANT
SNARE COLD EXACTO (MISCELLANEOUS) ×1 IMPLANT
TRAP ETRAP POLY (MISCELLANEOUS) ×1 IMPLANT
WATER STERILE IRR 250ML POUR (IV SOLUTION) ×3 IMPLANT
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
## 2021-09-19 NOTE — Transfer of Care (Signed)
Immediate Anesthesia Transfer of Care Note  Patient: Jimmy Simmons  Procedure(s) Performed: COLONOSCOPY WITH PROPOFOL (Rectum) POLYPECTOMY (Rectum)  Patient Location: PACU  Anesthesia Type: General  Level of Consciousness: awake, alert  and patient cooperative  Airway and Oxygen Therapy: Patient Spontanous Breathing and Patient connected to supplemental oxygen  Post-op Assessment: Post-op Vital signs reviewed, Patient's Cardiovascular Status Stable, Respiratory Function Stable, Patent Airway and No signs of Nausea or vomiting  Post-op Vital Signs: Reviewed and stable  Complications: No notable events documented.

## 2021-09-19 NOTE — Anesthesia Procedure Notes (Signed)
Date/Time: 09/19/2021 9:25 AM Performed by: Cameron Ali, CRNA Pre-anesthesia Checklist: Patient identified, Emergency Drugs available, Suction available, Timeout performed and Patient being monitored Patient Re-evaluated:Patient Re-evaluated prior to induction Oxygen Delivery Method: Nasal cannula Placement Confirmation: positive ETCO2

## 2021-09-19 NOTE — Anesthesia Preprocedure Evaluation (Signed)
Anesthesia Evaluation  Patient identified by MRN, date of birth, ID band Patient awake    Reviewed: Allergy & Precautions, H&P , NPO status   Airway Mallampati: II  TM Distance: >3 FB Neck ROM: full    Dental no notable dental hx.    Pulmonary COPD,  COPD inhaler, Current Smoker and Patient abstained from smoking.,    Pulmonary exam normal breath sounds clear to auscultation       Cardiovascular hypertension, Normal cardiovascular exam Rhythm:regular Rate:Normal     Neuro/Psych PSYCHIATRIC DISORDERS Anxiety Depression    GI/Hepatic (+) Hepatitis - (C - treated)  Endo/Other  Morbid obesity  Renal/GU      Musculoskeletal   Abdominal   Peds  Hematology   Anesthesia Other Findings   Reproductive/Obstetrics                             Anesthesia Physical  Anesthesia Plan  ASA: III  Anesthesia Plan: General   Post-op Pain Management:    Induction:   PONV Risk Score and Plan: 2 and Treatment may vary due to age or medical condition, TIVA and Propofol infusion  Airway Management Planned: Nasal Cannula and Natural Airway  Additional Equipment:   Intra-op Plan:   Post-operative Plan:   Informed Consent: I have reviewed the patients History and Physical, chart, labs and discussed the procedure including the risks, benefits and alternatives for the proposed anesthesia with the patient or authorized representative who has indicated his/her understanding and acceptance.     Dental Advisory Given  Plan Discussed with: CRNA  Anesthesia Plan Comments:         Anesthesia Quick Evaluation

## 2021-09-19 NOTE — Anesthesia Postprocedure Evaluation (Signed)
Anesthesia Post Note  Patient: Jimmy Simmons  Procedure(s) Performed: COLONOSCOPY WITH PROPOFOL (Rectum) POLYPECTOMY (Rectum)     Patient location during evaluation: PACU Anesthesia Type: General Level of consciousness: awake Pain management: pain level controlled Vital Signs Assessment: post-procedure vital signs reviewed and stable Respiratory status: respiratory function stable Cardiovascular status: stable Postop Assessment: no signs of nausea or vomiting Anesthetic complications: no   No notable events documented.  Veda Canning

## 2021-09-19 NOTE — Op Note (Addendum)
[facility] Patient Name: Jimmy Simmons Procedure Date: 09/19/2021 9:03 AM MRN: [id number] Account #: [id number] Date of Birth: December 13, 1960 Admit Type: Outpatient Age: 61 Room: [facility] OR ROOM 01 Gender: Male Note Status: Finalized Instrument Name: [id number] Procedure:             Colonoscopy Indications:           High risk colon cancer surveillance: Personal history                         of colonic polyps Providers:             Lucilla Lame MD, MD Referring MD:          Valerie Roys (Referring MD) Medicines:             Propofol per Anesthesia Complications:         No immediate complications. Procedure:             Pre-Anesthesia Assessment:                        - Prior to the procedure, a History and Physical was                         performed, and patient medications and allergies were                         reviewed. The patient's tolerance of previous                         anesthesia was also reviewed. The risks and benefits                         of the procedure and the sedation options and risks                         were discussed with the patient. All questions were                         answered, and informed consent was obtained. Prior                         Anticoagulants: The patient has taken no previous                         anticoagulant or antiplatelet agents. ASA Grade                         Assessment: II - A patient with mild systemic disease.                         After reviewing the risks and benefits, the patient                         was deemed in satisfactory condition to undergo the                         procedure.  After obtaining informed consent, the colonoscope was                         passed under direct vision. Throughout the procedure,                         the patient's blood pressure, pulse, and oxygen                         saturations were monitored  continuously. The                         Colonoscope was introduced through the anus and                         advanced to the the cecum, identified by appendiceal                         orifice and ileocecal valve. The colonoscopy was                         performed without difficulty. The patient tolerated                         the procedure well. The quality of the bowel                         preparation was excellent. Findings:      The perianal and digital rectal examinations were normal.      Five sessile polyps were found in the sigmoid colon. The polyps were 3       to 6 mm in size. These polyps were removed with a cold snare. Resection       and retrieval were complete.      Three sessile polyps were found in the rectum. The polyps were 3 to 4 mm       in size. These polyps were removed with a cold snare. Resection and       retrieval were complete.      Multiple small-mouthed diverticula were found in the sigmoid colon.      Non-bleeding internal hemorrhoids were found during retroflexion. The       hemorrhoids were Grade II (internal hemorrhoids that prolapse but reduce       spontaneously).      A 4 mm polyp was found in the transverse colon. The polyp was sessile.       The polyp was removed with a cold snare. Resection and retrieval were       complete.      The mucosa vascular pattern in the cecum was diffusely decreased. This       was biopsied with a cold forceps for histology. Impression:            - Five 3 to 6 mm polyps in the sigmoid colon, removed                         with a cold snare. Resected and retrieved.                        - Three 3 to 4 mm polyps  in the rectum, removed with a                         cold snare. Resected and retrieved.                        - Diverticulosis in the sigmoid colon.                        - Non-bleeding internal hemorrhoids.                        - One 4 mm polyp in the transverse colon, removed with                          a cold snare. Resected and retrieved.                        - Decreased mucosa vascular pattern in the cecum.                         Biopsied. Recommendation:        - Discharge patient to home.                        - Resume previous diet.                        - Continue present medications.                        - Await pathology results.                        - If the pathology report reveals adenomatous tissue,                         then repeat the colonoscopy for surveillance in 3                         years. Procedure Code(s):     --- Professional ---                        [phone], Colonoscopy, flexible; with removal of                         tumor(s), polyp(s), or other lesion(s) by snare                         technique                        [id number], 62, Colonoscopy, flexible; with biopsy, single                         or multiple Diagnosis Code(s):     --- Professional ---                        Z86.010, Personal history of colonic polyps  K63.5, Polyp of colon CPT copyright 2019 American Medical [employer]. All rights reserved. The codes documented in this report are preliminary and upon coder review may  be revised to meet current compliance requirements. Lucilla Lame MD, MD 09/19/2021 9:32:49 AM This report has been signed electronically. Number of Addenda: 0 Note Initiated On: 09/19/2021 9:03 AM Scope Withdrawal Time: 0 hours 7 minutes 38 seconds  Total Procedure Duration: 0 hours 16 minutes 5 seconds  Estimated Blood Loss:  Estimated blood loss: none.      [facility]

## 2021-09-20 ENCOUNTER — Encounter: Payer: Self-pay | Admitting: Gastroenterology

## 2021-09-20 LAB — SURGICAL PATHOLOGY

## 2021-09-30 ENCOUNTER — Other Ambulatory Visit: Payer: Self-pay | Admitting: *Deleted

## 2021-09-30 DIAGNOSIS — F17210 Nicotine dependence, cigarettes, uncomplicated: Secondary | ICD-10-CM

## 2021-09-30 DIAGNOSIS — Z87891 Personal history of nicotine dependence: Secondary | ICD-10-CM

## 2021-10-09 ENCOUNTER — Other Ambulatory Visit: Payer: Self-pay

## 2021-10-09 ENCOUNTER — Ambulatory Visit
Admission: RE | Admit: 2021-10-09 | Discharge: 2021-10-09 | Disposition: A | Discharge status: Home / Self Care | Payer: Medicaid Other | Source: Ambulatory Visit | Attending: Acute Care | Admitting: Acute Care

## 2021-10-09 DIAGNOSIS — F17210 Nicotine dependence, cigarettes, uncomplicated: Secondary | ICD-10-CM | POA: Insufficient documentation

## 2021-10-09 DIAGNOSIS — Z87891 Personal history of nicotine dependence: Secondary | ICD-10-CM | POA: Insufficient documentation

## 2021-10-11 ENCOUNTER — Other Ambulatory Visit: Payer: Self-pay

## 2021-10-11 DIAGNOSIS — F17210 Nicotine dependence, cigarettes, uncomplicated: Secondary | ICD-10-CM

## 2021-10-11 DIAGNOSIS — Z87891 Personal history of nicotine dependence: Secondary | ICD-10-CM

## 2021-10-23 ENCOUNTER — Other Ambulatory Visit: Payer: Self-pay | Admitting: Family Medicine

## 2021-10-23 NOTE — Telephone Encounter (Signed)
Requested medication (s) are due for refill today: Yes  Requested medication (s) are on the active medication list: Yes  Last refill:  08/27/21  Future visit scheduled: Yes  Notes to clinic:  See request.    Requested Prescriptions  Pending Prescriptions Disp Refills   tiZANidine (ZANAFLEX) 4 MG tablet [Pharmacy Med Name: TIZANIDINE HCL 4 MG TABLET] 90 tablet 1    Sig: TAKE 1 TABLET BY MOUTH EVERY 6 HOURS AS NEEDED FOR MUSCLE SPASMS.     Not Delegated - Cardiovascular:  Alpha-2 Agonists - tizanidine Failed - 10/23/2021  2:51 AM      Failed - This refill cannot be delegated      Passed - Valid encounter within last 6 months    Recent Outpatient Visits           1 month ago Routine general medical examination at a health care facility   [facility], [place], DO   7 months ago RUQ pain   [place], [place], DO   1 year ago Osteoarthritis of spine with radiculopathy, lumbar region   [facility], [place], DO   1 year ago Radicular leg pain   [place], [place], DO   1 year ago Right hip pain   [place], Barb Merino, DO       Future Appointments             In 4 months [name], Barb Merino, DO [facility], [facility]

## 2022-02-27 ENCOUNTER — Ambulatory Visit: Payer: Medicaid Other | Admitting: Family Medicine

## 2022-03-07 ENCOUNTER — Other Ambulatory Visit: Payer: Self-pay | Admitting: Family Medicine

## 2022-03-07 NOTE — Telephone Encounter (Signed)
Requested Prescriptions  Pending Prescriptions Disp Refills  . lisinopril-hydrochlorothiazide (ZESTORETIC) 20-25 MG tablet [Pharmacy Med Name: LISINOPRIL-HCTZ 20-25 MG TAB] 90 tablet 0    Sig: TAKE 1 TABLET BY MOUTH EVERY DAY     Cardiovascular:  ACEI + Diuretic Combos Failed - 03/07/2022  1:58 AM      Failed - Na in normal range and within 180 days    Sodium  Date Value Ref Range Status  08/29/2021 135 134 - 144 mmol/L Final         Failed - K in normal range and within 180 days    Potassium  Date Value Ref Range Status  08/29/2021 4.1 3.5 - 5.2 mmol/L Final         Failed - Cr in normal range and within 180 days    Creatinine, Ser  Date Value Ref Range Status  08/29/2021 0.96 0.76 - 1.27 mg/dL Final         Failed - eGFR is 30 or above and within 180 days    GFR calc Af Amer  Date Value Ref Range Status  01/05/2020 85 >59 mL/min/1.73 Final    Comment:    **[facility] currently reports eGFR in compliance with the current**   recommendations of the [employer]. [facility] will   update reporting as new guidelines are published from the NKF-ASN   Task force.    GFR calc non Af Amer  Date Value Ref Range Status  01/05/2020 73 >59 mL/min/1.73 Final   eGFR  Date Value Ref Range Status  08/29/2021 91 >59 mL/min/1.73 Final         Failed - Valid encounter within last 6 months    Recent Outpatient Visits          6 months ago Routine general medical examination at a health care facility   [facility], [place], DO   1 year ago RUQ pain   [place], [place], DO   1 year ago Osteoarthritis of spine with radiculopathy, lumbar region   [facility], [place], DO   1 year ago Radicular leg pain   [place], [place], DO   1 year ago Right hip pain   [place], [place], DO             Passed - Patient is not pregnant      Passed - Last BP in normal  range    BP Readings from Last 1 Encounters:  09/19/21 (!) 90/57

## 2022-06-13 ENCOUNTER — Other Ambulatory Visit: Payer: Self-pay | Admitting: Family Medicine

## 2022-06-13 NOTE — Telephone Encounter (Signed)
Requested medication (s) are due for refill today: yes  Requested medication (s) are on the active medication list yes  Last refill:  03/07/22  Future visit scheduled: yes  Notes to clinic:  Unable to refill per protocol, courtesy refill already given, routing for provider approval.  Patient has CPE schedule for 08/29/22.     Requested Prescriptions  Pending Prescriptions Disp Refills   lisinopril-hydrochlorothiazide (ZESTORETIC) 20-25 MG tablet [Pharmacy Med Name: LISINOPRIL-HCTZ 20-25 MG TAB] 90 tablet 0    Sig: TAKE 1 TABLET BY MOUTH EVERY DAY     Cardiovascular:  ACEI + Diuretic Combos Failed - 06/13/2022  2:25 AM      Failed - Na in normal range and within 180 days    Sodium  Date Value Ref Range Status  08/29/2021 135 134 - 144 mmol/L Final         Failed - K in normal range and within 180 days    Potassium  Date Value Ref Range Status  08/29/2021 4.1 3.5 - 5.2 mmol/L Final         Failed - Cr in normal range and within 180 days    Creatinine, Ser  Date Value Ref Range Status  08/29/2021 0.96 0.76 - 1.27 mg/dL Final         Failed - eGFR is 30 or above and within 180 days    GFR calc Af Amer  Date Value Ref Range Status  01/05/2020 85 >59 mL/min/1.73 Final    Comment:    **[facility] currently reports eGFR in compliance with the current**   recommendations of the [employer]. [facility] will   update reporting as new guidelines are published from the NKF-ASN   Task force.    GFR calc non Af Amer  Date Value Ref Range Status  01/05/2020 73 >59 mL/min/1.73 Final   eGFR  Date Value Ref Range Status  08/29/2021 91 >59 mL/min/1.73 Final         Failed - Valid encounter within last 6 months    Recent Outpatient Visits           9 months ago Routine general medical examination at a health care facility   [facility], [place], DO   1 year ago RUQ pain   [place], [place], DO   1 year ago Osteoarthritis  of spine with radiculopathy, lumbar region   [facility], [place], DO   1 year ago Radicular leg pain   [place], [place], DO   1 year ago Right hip pain   [place], [place], DO       Future Appointments             In 2 months Johnson, Megan P, DO [place], [place] - Patient is not pregnant      Passed - Last BP in normal range    BP Readings from Last 1 Encounters:  09/19/21 (!) 90/57

## 2022-07-17 ENCOUNTER — Other Ambulatory Visit: Payer: Self-pay | Admitting: Family Medicine

## 2022-07-17 NOTE — Telephone Encounter (Signed)
Requested medication (s) are due for refill today -yes  Requested medication (s) are on the active medication list -yes  Future visit scheduled -yes  Last refill: 03/07/22 #90  Notes to clinic: Last refill was courtesy fill- patient has appointment scheduled 08/29/22- sent for provider review  of request  Requested Prescriptions  Pending Prescriptions Disp Refills   lisinopril-hydrochlorothiazide (ZESTORETIC) 20-25 MG tablet [Pharmacy Med Name: LISINOPRIL-HCTZ 20-25 MG TAB] 90 tablet 0    Sig: TAKE 1 TABLET BY MOUTH EVERY DAY     Cardiovascular:  ACEI + Diuretic Combos Failed - 07/17/2022 11:23 AM      Failed - Na in normal range and within 180 days    Sodium  Date Value Ref Range Status  08/29/2021 135 134 - 144 mmol/L Final         Failed - K in normal range and within 180 days    Potassium  Date Value Ref Range Status  08/29/2021 4.1 3.5 - 5.2 mmol/L Final         Failed - Cr in normal range and within 180 days    Creatinine, Ser  Date Value Ref Range Status  08/29/2021 0.96 0.76 - 1.27 mg/dL Final         Failed - eGFR is 30 or above and within 180 days    GFR calc Af Amer  Date Value Ref Range Status  01/05/2020 85 >59 mL/min/1.73 Final    Comment:    **[facility] currently reports eGFR in compliance with the current**   recommendations of the [employer]. [facility] will   update reporting as new guidelines are published from the NKF-ASN   Task force.    GFR calc non Af Amer  Date Value Ref Range Status  01/05/2020 73 >59 mL/min/1.73 Final   eGFR  Date Value Ref Range Status  08/29/2021 91 >59 mL/min/1.73 Final         Failed - Valid encounter within last 6 months    Recent Outpatient Visits           10 months ago Routine general medical examination at a health care facility   [facility] [place], DO   1 year ago RUQ pain   [place], [place], DO   1 year ago Osteoarthritis of spine with  radiculopathy, lumbar region   [facility], [place], DO   1 year ago Radicular leg pain   [place], [place], DO   2 years ago Right hip pain   [place], [place], DO       Future Appointments             In 1 month Johnson, Megan P, DO [place], [place] - Patient is not pregnant      Passed - Last BP in normal range    BP Readings from Last 1 Encounters:  09/19/21 (!) 90/57            Requested Prescriptions  Pending Prescriptions Disp Refills   lisinopril-hydrochlorothiazide (ZESTORETIC) 20-25 MG tablet [Pharmacy Med Name: LISINOPRIL-HCTZ 20-25 MG TAB] 90 tablet 0    Sig: TAKE 1 TABLET BY MOUTH EVERY DAY     Cardiovascular:  ACEI + Diuretic Combos Failed - 07/17/2022 11:23 AM      Failed - Na in normal range and within 180 days  Sodium  Date Value Ref Range Status  08/29/2021 135 134 - 144 mmol/L Final         Failed - K in normal range and within 180 days    Potassium  Date Value Ref Range Status  08/29/2021 4.1 3.5 - 5.2 mmol/L Final         Failed - Cr in normal range and within 180 days    Creatinine, Ser  Date Value Ref Range Status  08/29/2021 0.96 0.76 - 1.27 mg/dL Final         Failed - eGFR is 30 or above and within 180 days    GFR calc Af Amer  Date Value Ref Range Status  01/05/2020 85 >59 mL/min/1.73 Final    Comment:    **[facility] currently reports eGFR in compliance with the current**   recommendations of the [employer]. [facility] will   update reporting as new guidelines are published from the NKF-ASN   Task force.    GFR calc non Af Amer  Date Value Ref Range Status  01/05/2020 73 >59 mL/min/1.73 Final   eGFR  Date Value Ref Range Status  08/29/2021 91 >59 mL/min/1.73 Final         Failed - Valid encounter within last 6 months    Recent Outpatient Visits           10 months ago Routine general medical examination  at a health care facility   [facility], [place], DO   1 year ago RUQ pain   [place], [place], DO   1 year ago Osteoarthritis of spine with radiculopathy, lumbar region   [facility], [place], DO   1 year ago Radicular leg pain   [place], [place], DO   2 years ago Right hip pain   [place], [place], DO       Future Appointments             In 1 month Johnson, Megan P, DO [place], [place] - Patient is not pregnant      Passed - Last BP in normal range    BP Readings from Last 1 Encounters:  09/19/21 (!) 90/57

## 2022-07-17 NOTE — Telephone Encounter (Signed)
Requested medication (s) are due for refill today - yes  Requested medication (s) are on the active medication list -yes  Future visit scheduled -yes  Last refill: 03/07/22 #90  Notes to clinic: Last fill was courtesy fill- patient has scheduled appointment 12/22- sent for provider review of request  Requested Prescriptions  Pending Prescriptions Disp Refills   lisinopril-hydrochlorothiazide (ZESTORETIC) 20-25 MG tablet [Pharmacy Med Name: LISINOPRIL-HCTZ 20-25 MG TAB] 90 tablet 0    Sig: TAKE 1 TABLET BY MOUTH EVERY DAY     Cardiovascular:  ACEI + Diuretic Combos Failed - 07/17/2022 11:23 AM      Failed - Na in normal range and within 180 days    Sodium  Date Value Ref Range Status  08/29/2021 135 134 - 144 mmol/L Final         Failed - K in normal range and within 180 days    Potassium  Date Value Ref Range Status  08/29/2021 4.1 3.5 - 5.2 mmol/L Final         Failed - Cr in normal range and within 180 days    Creatinine, Ser  Date Value Ref Range Status  08/29/2021 0.96 0.76 - 1.27 mg/dL Final         Failed - eGFR is 30 or above and within 180 days    GFR calc Af Amer  Date Value Ref Range Status  01/05/2020 85 >59 mL/min/1.73 Final    Comment:    **[facility] currently reports eGFR in compliance with the current**   recommendations of the [employer]. [facility] will   update reporting as new guidelines are published from the NKF-ASN   Task force.    GFR calc non Af Amer  Date Value Ref Range Status  01/05/2020 73 >59 mL/min/1.73 Final   eGFR  Date Value Ref Range Status  08/29/2021 91 >59 mL/min/1.73 Final         Failed - Valid encounter within last 6 months    Recent Outpatient Visits           10 months ago Routine general medical examination at a health care facility   [facility], [place], DO   1 year ago RUQ pain   [place], [place], DO   1 year ago Osteoarthritis of spine with  radiculopathy, lumbar region   [facility], [place], DO   1 year ago Radicular leg pain   [place], [place], DO   2 years ago Right hip pain   [place], [place], DO       Future Appointments             In 1 month Johnson, Megan P, DO [place], [place] - Patient is not pregnant      Passed - Last BP in normal range    BP Readings from Last 1 Encounters:  09/19/21 (!) 90/57            Requested Prescriptions  Pending Prescriptions Disp Refills   lisinopril-hydrochlorothiazide (ZESTORETIC) 20-25 MG tablet [Pharmacy Med Name: LISINOPRIL-HCTZ 20-25 MG TAB] 90 tablet 0    Sig: TAKE 1 TABLET BY MOUTH EVERY DAY     Cardiovascular:  ACEI + Diuretic Combos Failed - 07/17/2022 11:23 AM      Failed - Na in normal range and within 180 days  Sodium  Date Value Ref Range Status  08/29/2021 135 134 - 144 mmol/L Final         Failed - K in normal range and within 180 days    Potassium  Date Value Ref Range Status  08/29/2021 4.1 3.5 - 5.2 mmol/L Final         Failed - Cr in normal range and within 180 days    Creatinine, Ser  Date Value Ref Range Status  08/29/2021 0.96 0.76 - 1.27 mg/dL Final         Failed - eGFR is 30 or above and within 180 days    GFR calc Af Amer  Date Value Ref Range Status  01/05/2020 85 >59 mL/min/1.73 Final    Comment:    **[facility] currently reports eGFR in compliance with the current**   recommendations of the [employer]. [facility] will   update reporting as new guidelines are published from the NKF-ASN   Task force.    GFR calc non Af Amer  Date Value Ref Range Status  01/05/2020 73 >59 mL/min/1.73 Final   eGFR  Date Value Ref Range Status  08/29/2021 91 >59 mL/min/1.73 Final         Failed - Valid encounter within last 6 months    Recent Outpatient Visits           10 months ago Routine general medical examination  at a health care facility   [facility], [place], DO   1 year ago RUQ pain   [place], [place], DO   1 year ago Osteoarthritis of spine with radiculopathy, lumbar region   [facility], [place], DO   1 year ago Radicular leg pain   [place], [place], DO   2 years ago Right hip pain   [place], [place], DO       Future Appointments             In 1 month Johnson, Megan P, DO [place], [place] - Patient is not pregnant      Passed - Last BP in normal range    BP Readings from Last 1 Encounters:  09/19/21 (!) 90/57

## 2022-08-20 ENCOUNTER — Other Ambulatory Visit: Payer: Self-pay | Admitting: Family Medicine

## 2022-08-20 NOTE — Telephone Encounter (Signed)
Requested medication (s) are due for refill today: yes  Requested medication (s) are on the active medication list: yes  Last refill:  07/17/22 #30/0  Future visit scheduled: yes 08/29/22  Notes to clinic:  pt is due for appt and updated labs. Has apt on 08/29/22     Requested Prescriptions  Pending Prescriptions Disp Refills   lisinopril-hydrochlorothiazide (ZESTORETIC) 20-25 MG tablet [Pharmacy Med Name: LISINOPRIL-HCTZ 20-25 MG TAB] 90 tablet 1    Sig: TAKE 1 TABLET BY MOUTH EVERY DAY     Cardiovascular:  ACEI + Diuretic Combos Failed - 08/20/2022 12:31 PM      Failed - Na in normal range and within 180 days    Sodium  Date Value Ref Range Status  08/29/2021 135 134 - 144 mmol/L Final         Failed - K in normal range and within 180 days    Potassium  Date Value Ref Range Status  08/29/2021 4.1 3.5 - 5.2 mmol/L Final         Failed - Cr in normal range and within 180 days    Creatinine, Ser  Date Value Ref Range Status  08/29/2021 0.96 0.76 - 1.27 mg/dL Final         Failed - eGFR is 30 or above and within 180 days    GFR calc Af Amer  Date Value Ref Range Status  01/05/2020 85 >59 mL/min/1.73 Final    Comment:    **[facility] currently reports eGFR in compliance with the current**   recommendations of the [employer]. [facility] will   update reporting as new guidelines are published from the NKF-ASN   Task force.    GFR calc non Af Amer  Date Value Ref Range Status  01/05/2020 73 >59 mL/min/1.73 Final   eGFR  Date Value Ref Range Status  08/29/2021 91 >59 mL/min/1.73 Final         Failed - Valid encounter within last 6 months    Recent Outpatient Visits           11 months ago Routine general medical examination at a health care facility   [facility], [place], DO   1 year ago RUQ pain   [place], [place], DO   1 year ago Osteoarthritis of spine with radiculopathy, lumbar region   [facility], [place], DO   2 years ago Radicular leg pain   [place], [place], DO   2 years ago Right hip pain   [place], [place], DO       Future Appointments             In 1 week Johnson, Megan P, DO [place], [place] - Patient is not pregnant      Passed - Last BP in normal range    BP Readings from Last 1 Encounters:  09/19/21 (!) 90/57

## 2022-08-20 NOTE — Telephone Encounter (Signed)
Upcoming appointment

## 2022-08-29 ENCOUNTER — Encounter: Payer: Medicaid Other | Admitting: Family Medicine

## 2022-09-05 ENCOUNTER — Encounter: Payer: Self-pay | Admitting: Family Medicine

## 2022-09-05 ENCOUNTER — Ambulatory Visit (INDEPENDENT_AMBULATORY_CARE_PROVIDER_SITE_OTHER): Payer: Medicaid Other | Admitting: Family Medicine

## 2022-09-05 VITALS — BP 137/84 | HR 80 | Temp 98.2°F | Ht 69.0 in | Wt 260.4 lb

## 2022-09-05 DIAGNOSIS — R8281 Pyuria: Secondary | ICD-10-CM

## 2022-09-05 DIAGNOSIS — N4889 Other specified disorders of penis: Secondary | ICD-10-CM | POA: Diagnosis not present

## 2022-09-05 DIAGNOSIS — Z136 Encounter for screening for cardiovascular disorders: Secondary | ICD-10-CM | POA: Diagnosis not present

## 2022-09-05 DIAGNOSIS — I10 Essential (primary) hypertension: Secondary | ICD-10-CM | POA: Diagnosis not present

## 2022-09-05 DIAGNOSIS — Z0000 Encounter for general adult medical examination without abnormal findings: Secondary | ICD-10-CM | POA: Diagnosis not present

## 2022-09-05 DIAGNOSIS — J449 Chronic obstructive pulmonary disease, unspecified: Secondary | ICD-10-CM | POA: Diagnosis not present

## 2022-09-05 DIAGNOSIS — Z720 Tobacco use: Secondary | ICD-10-CM

## 2022-09-05 DIAGNOSIS — N529 Male erectile dysfunction, unspecified: Secondary | ICD-10-CM

## 2022-09-05 DIAGNOSIS — Z23 Encounter for immunization: Secondary | ICD-10-CM | POA: Diagnosis not present

## 2022-09-05 LAB — URINALYSIS, ROUTINE W REFLEX MICROSCOPIC
Bilirubin, UA: NEGATIVE
Glucose, UA: NEGATIVE
Ketones, UA: NEGATIVE
Nitrite, UA: NEGATIVE
Protein,UA: NEGATIVE
RBC, UA: NEGATIVE
Specific Gravity, UA: 1.02 (ref 1.005–1.030)
Urobilinogen, Ur: 0.2 mg/dL (ref 0.2–1.0)
pH, UA: 5.5 (ref 5.0–7.5)

## 2022-09-05 LAB — MICROSCOPIC EXAMINATION: Bacteria, UA: NONE SEEN

## 2022-09-05 LAB — MICROALBUMIN, URINE WAIVED
Creatinine, Urine Waived: 100 mg/dL (ref 10–300)
Microalb, Ur Waived: 10 mg/L (ref 0–19)
Microalb/Creat Ratio: <30 mg/g (ref <30)

## 2022-09-05 MED ORDER — ALBUTEROL SULFATE HFA 108 (90 BASE) MCG/ACT IN AERS: 2.0000 | INHALATION_SPRAY | Freq: Four times a day (QID) | RESPIRATORY_TRACT | 12 refills | Status: DC | PRN

## 2022-09-05 MED ORDER — FLUTICASONE-SALMETEROL 250-50 MCG/ACT IN AEPB: INHALATION_SPRAY | RESPIRATORY_TRACT | 1 refills | Status: DC

## 2022-09-05 MED ORDER — LISINOPRIL-HYDROCHLOROTHIAZIDE 20-25 MG PO TABS: 1.0000 | ORAL_TABLET | Freq: Every day | ORAL | 1 refills | Status: DC

## 2022-09-05 NOTE — Assessment & Plan Note (Signed)
Under good control on current regimen. Continue current regimen. Continue to monitor. Call with any concerns. Refills given. Labs drawn today.   

## 2022-09-05 NOTE — Assessment & Plan Note (Signed)
Due for low-dose CT. Ordered today. Await results.
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
## 2022-09-06 LAB — COMPREHENSIVE METABOLIC PANEL
ALT: 28 IU/L (ref 0–44)
AST: 20 IU/L (ref 0–40)
Albumin/Globulin Ratio: 1.8 (ref 1.2–2.2)
Albumin: 4.3 g/dL (ref 3.8–4.9)
Alkaline Phosphatase: 60 IU/L (ref 44–121)
BUN/Creatinine Ratio: 13 (ref 10–24)
BUN: 13 mg/dL (ref 8–27)
Bilirubin Total: 0.2 mg/dL (ref 0.0–1.2)
CO2: 21 mmol/L (ref 20–29)
Calcium: 9.6 mg/dL (ref 8.6–10.2)
Chloride: 101 mmol/L (ref 96–106)
Creatinine, Ser: 1.01 mg/dL (ref 0.76–1.27)
Globulin, Total: 2.4 g/dL (ref 1.5–4.5)
Glucose: 148 mg/dL — ABNORMAL HIGH (ref 70–99)
Potassium: 4.1 mmol/L (ref 3.5–5.2)
Sodium: 136 mmol/L (ref 134–144)
Total Protein: 6.7 g/dL (ref 6.0–8.5)
eGFR: 85 mL/min/{1.73_m2} (ref >59)

## 2022-09-06 LAB — CBC WITH DIFFERENTIAL/PLATELET
Basophils Absolute: 0.1 10*3/uL (ref 0.0–0.2)
Basos: 1 %
EOS (ABSOLUTE): 0.2 10*3/uL (ref 0.0–0.4)
Eos: 2 %
Hematocrit: 44 % (ref 37.5–51.0)
Hemoglobin: 15.1 g/dL (ref 13.0–17.7)
Immature Grans (Abs): 0 10*3/uL (ref 0.0–0.1)
Immature Granulocytes: 0 %
Lymphocytes Absolute: 2.5 10*3/uL (ref 0.7–3.1)
Lymphs: 30 %
MCH: 33.9 pg — ABNORMAL HIGH (ref 26.6–33.0)
MCHC: 34.3 g/dL (ref 31.5–35.7)
MCV: 99 fL — ABNORMAL HIGH (ref 79–97)
Monocytes Absolute: 0.5 10*3/uL (ref 0.1–0.9)
Monocytes: 6 %
Neutrophils Absolute: 5 10*3/uL (ref 1.4–7.0)
Neutrophils: 61 %
Platelets: 188 10*3/uL (ref 150–450)
RBC: 4.46 x10E6/uL (ref 4.14–5.80)
RDW: 12.4 % (ref 11.6–15.4)
WBC: 8.3 10*3/uL (ref 3.4–10.8)

## 2022-09-06 LAB — PSA: Prostate Specific Ag, Serum: 1.1 ng/mL (ref 0.0–4.0)

## 2022-09-06 LAB — LIPID PANEL W/O CHOL/HDL RATIO
Cholesterol, Total: 169 mg/dL (ref 100–199)
HDL: 43 mg/dL (ref >39)
LDL Chol Calc (NIH): 105 mg/dL — ABNORMAL HIGH (ref 0–99)
Triglycerides: 113 mg/dL (ref 0–149)
VLDL Cholesterol Cal: 21 mg/dL (ref 5–40)

## 2022-09-06 LAB — TSH: TSH: 1.94 u[IU]/mL (ref 0.450–4.500)

## 2022-09-07 LAB — URINE CULTURE: Organism ID, Bacteria: NO GROWTH

## 2022-09-09 ENCOUNTER — Encounter: Payer: Self-pay | Admitting: Family Medicine

## 2022-09-26 ENCOUNTER — Other Ambulatory Visit: Payer: Self-pay | Admitting: Family Medicine

## 2022-09-26 NOTE — Telephone Encounter (Signed)
Requested medication (s) are due for refill today:   Yes  Requested medication (s) are on the active medication list:   Yes  Future visit scheduled:   N/A   Last ordered: Returned because  Advair not available.   It's on backorder.     Requested Prescriptions  Pending Prescriptions Disp Refills   WIXELA INHUB 250-50 MCG/ACT AEPB [Pharmacy Med Name: WIXELA 250-50 INHUB] 180 each 1    Sig: INHALE 1 PUFF BY MOUTH TWICE A DAY     Pulmonology:  Combination Products Passed - 09/26/2022  8:50 AM      Passed - Valid encounter within last 12 months    Recent Outpatient Visits           3 weeks ago Routine general medical examination at a health care facility   [place], [place], DO   1 year ago Routine general medical examination at a health care facility   [place], [place], DO   1 year ago RUQ pain   [place], [place], DO   2 years ago Osteoarthritis of spine with radiculopathy, lumbar region   [place], [place], DO   2 years ago Radicular leg pain   [place], Barb Merino, DO       Future Appointments             In 3 days Stoioff, Ronda Fairly, MD [facility]   In 5 months [name], Barb Merino, DO [place], [place]

## 2022-09-29 ENCOUNTER — Ambulatory Visit: Payer: Medicaid Other | Admitting: Urology

## 2022-10-06 ENCOUNTER — Telehealth: Payer: Self-pay | Admitting: Family Medicine

## 2022-10-06 NOTE — Telephone Encounter (Signed)
Jimmy Simmons can you assist with this please?

## 2022-10-06 NOTE — Telephone Encounter (Signed)
Copied from [place] [id number]. Topic: General - Other >> Oct 06, 2022 10:48 AM Chapman Fitch wrote: Reason for CRM: Prior authorization is needed for his CT schedule for 2.1.24/ please advise and call Melissa when this is complete at [phone] ext [phone]

## 2022-10-09 ENCOUNTER — Ambulatory Visit
Admission: RE | Admit: 2022-10-09 | Discharge: 2022-10-09 | Disposition: A | Discharge status: Home / Self Care | Payer: Medicaid Other | Source: Ambulatory Visit | Attending: Family Medicine | Admitting: Family Medicine

## 2022-10-09 ENCOUNTER — Ambulatory Visit: Payer: Medicaid Other

## 2022-10-09 DIAGNOSIS — Z720 Tobacco use: Secondary | ICD-10-CM | POA: Diagnosis present

## 2022-10-14 ENCOUNTER — Other Ambulatory Visit: Payer: Self-pay | Admitting: Family Medicine

## 2022-10-14 DIAGNOSIS — N529 Male erectile dysfunction, unspecified: Secondary | ICD-10-CM

## 2022-10-27 ENCOUNTER — Encounter: Payer: Self-pay | Admitting: Urology

## 2022-10-27 ENCOUNTER — Ambulatory Visit: Payer: Medicaid Other | Admitting: Urology

## 2023-01-07 ENCOUNTER — Other Ambulatory Visit: Payer: Self-pay | Admitting: Family Medicine

## 2023-01-08 NOTE — Telephone Encounter (Signed)
Requested medication (s) are due for refill today -yes  Requested medication (s) are on the active medication list -yes  Future visit scheduled -yes  Last refill: Wixela 09/29/22 180ea  1RF- replaced for Advair                 Tizanidine 10/23/22 #90 1RF - non delegated Rx  Notes to clinic: see above  Requested Prescriptions  Pending Prescriptions Disp Refills   ADVAIR DISKUS 250-50 MCG/ACT AEPB [Pharmacy Med Name: ADVAIR 250-50 DISKUS] 180 each 1    Sig: INHALE 1 PUFF BY MOUTH TWICE A DAY     Pulmonology:  Combination Products Passed - 01/07/2023  2:47 PM      Passed - Valid encounter within last 12 months    Recent Outpatient Visits           4 months ago Routine general medical examination at a health care facility   [facility] [place], [place] P, DO   1 year ago Routine general medical examination at a health care facility   [facility] [place], [place] P, DO   1 year ago RUQ pain   [place] [facility] [place], [place] P, DO   2 years ago Osteoarthritis of spine with radiculopathy, lumbar region   [facility] [place], [place], DO   2 years ago Radicular leg pain   [facility] [place], [place], DO       Future Appointments             In 2 months Laural Benes, Oralia Rud, DO [employer] [facility]             tiZANidine (ZANAFLEX) 4 MG tablet [Pharmacy Med Name: TIZANIDINE HCL 4 MG TABLET] 90 tablet 1    Sig: TAKE 1 TABLET BY MOUTH EVERY 6 HOURS AS NEEDED FOR MUSCLE SPASMS.     Not Delegated - Cardiovascular:  Alpha-2 Agonists - tizanidine Failed - 01/07/2023  2:47 PM      Failed - This refill cannot be delegated      Passed - Valid encounter within last 6 months    Recent Outpatient Visits           4 months ago Routine general medical examination at a health care facility   [facility] [place], [place] P, DO   1 year  ago Routine general medical examination at a health care facility   [facility] [place], [place] P, DO   1 year ago RUQ pain   [place] [place], [place] P, DO   2 years ago Osteoarthritis of spine with radiculopathy, lumbar region   [facility] [place], [place], DO   2 years ago Radicular leg pain   [place] [facility] [place], Oralia Rud, DO       Future Appointments             In 2 months Laural Benes, Oralia Rud, DO [place] [facility]               Requested Prescriptions  Pending Prescriptions Disp Refills   ADVAIR DISKUS 250-50 MCG/ACT AEPB [Pharmacy Med Name: ADVAIR 250-50 DISKUS] 180 each 1    Sig: INHALE 1 PUFF BY MOUTH TWICE A DAY     Pulmonology:  Combination Products Passed - 01/07/2023  2:47 PM  Passed - Valid encounter within last 12 months    Recent Outpatient Visits           4 months ago Routine general medical examination at a health care facility   [facility] [place], [place] P, DO   1 year ago Routine general medical examination at a health care facility   [facility] [place], [place] P, DO   1 year ago RUQ pain   [facility] [place], [place] P, DO   2 years ago Osteoarthritis of spine with radiculopathy, lumbar region   [facility] [place], [place], DO   2 years ago Radicular leg pain   [place] [place], Oralia Rud, DO       Future Appointments             In 2 months Laural Benes, Oralia Rud, DO [place] [facility]             tiZANidine (ZANAFLEX) 4 MG tablet [Pharmacy Med Name: TIZANIDINE HCL 4 MG TABLET] 90 tablet 1    Sig: TAKE 1 TABLET BY MOUTH EVERY 6 HOURS AS NEEDED FOR MUSCLE SPASMS.     Not Delegated - Cardiovascular:  Alpha-2 Agonists - tizanidine Failed - 01/07/2023  2:47 PM      Failed -  This refill cannot be delegated      Passed - Valid encounter within last 6 months    Recent Outpatient Visits           4 months ago Routine general medical examination at a health care facility   [facility] [place], [place] P, DO   1 year ago Routine general medical examination at a health care facility   [facility] [place], [place] P, DO   1 year ago RUQ pain   [facility] [place], [place] P, DO   2 years ago Osteoarthritis of spine with radiculopathy, lumbar region   [facility] [place], [place], DO   2 years ago Radicular leg pain   [place] [facility] [place], Oralia Rud, DO       Future Appointments             In 2 months Laural Benes, Oralia Rud, DO [place] [facility]

## 2023-01-09 ENCOUNTER — Other Ambulatory Visit: Payer: Self-pay | Admitting: Family Medicine

## 2023-01-12 NOTE — Telephone Encounter (Signed)
Refused Wixela because it's being requested too soon.  09/29/2022 #180, 1 refill.

## 2023-03-09 ENCOUNTER — Encounter: Payer: Self-pay | Admitting: Family Medicine

## 2023-03-09 ENCOUNTER — Ambulatory Visit (INDEPENDENT_AMBULATORY_CARE_PROVIDER_SITE_OTHER): Payer: Medicaid Other | Admitting: Family Medicine

## 2023-03-09 ENCOUNTER — Other Ambulatory Visit: Payer: Self-pay | Admitting: Family Medicine

## 2023-03-09 DIAGNOSIS — I10 Essential (primary) hypertension: Secondary | ICD-10-CM | POA: Diagnosis not present

## 2023-03-09 DIAGNOSIS — F3341 Major depressive disorder, recurrent, in partial remission: Secondary | ICD-10-CM

## 2023-03-09 DIAGNOSIS — J449 Chronic obstructive pulmonary disease, unspecified: Secondary | ICD-10-CM

## 2023-03-09 DIAGNOSIS — I2510 Atherosclerotic heart disease of native coronary artery without angina pectoris: Secondary | ICD-10-CM | POA: Diagnosis not present

## 2023-03-09 MED ORDER — FLUTICASONE-SALMETEROL 250-50 MCG/ACT IN AEPB: INHALATION_SPRAY | RESPIRATORY_TRACT | 1 refills | Status: DC

## 2023-03-09 MED ORDER — ALBUTEROL SULFATE HFA 108 (90 BASE) MCG/ACT IN AERS: 2.0000 | INHALATION_SPRAY | Freq: Four times a day (QID) | RESPIRATORY_TRACT | 12 refills | Status: DC | PRN

## 2023-03-09 MED ORDER — LOSARTAN POTASSIUM-HCTZ 50-12.5 MG PO TABS: 1.0000 | ORAL_TABLET | Freq: Every day | ORAL | 2 refills | Status: DC

## 2023-03-09 NOTE — Addendum Note (Signed)
Addended by: Dorcas Carrow on: 03/09/2023 11:45 AM   Modules accepted: Orders

## 2023-03-09 NOTE — Assessment & Plan Note (Signed)
Has not been taking his medicine. Will change to losartan and recheck 1 month. Call with any concerns.

## 2023-03-09 NOTE — Assessment & Plan Note (Signed)
Doing well off medicine. Continue to monitor. Call with any concerns.  

## 2023-03-09 NOTE — Progress Notes (Signed)
BP (!) 167/88   Pulse 94   Temp 97.7 F (36.5 C) (Oral)   Wt 256 lb 6.4 oz (116.3 kg)   SpO2 97%   BMI 37.86 kg/m    Subjective:    Patient ID: [name], male    DOB: [birth year], 62 y.o.   MRN: [id number]  HPI: Orison Acy is a 62 y.o. male  Chief Complaint  Patient presents with   Hypertension    Pt states he quit taking his blood pressure medicine May 1st   COPD   HYPERTENSION- stopped his medicine about 2 months ago because he was afraid it was giving him a cough and messing with his libido  Hypertension status: uncontrolled  Satisfied with current treatment? no Duration of hypertension: chronic BP monitoring frequency:  not checking BP medication side effects:  no Medication compliance: excellent compliance Previous BP meds:lisinopril-HCTZ Aspirin: no Recurrent headaches: no Visual changes: no Palpitations: no Dyspnea: no Chest pain: no Lower extremity edema: no Dizzy/lightheaded: no  COPD COPD status: stable Satisfied with current treatment?: no Oxygen use: no Dyspnea frequency: rarely Cough frequency: all day Rescue inhaler frequency: rarely  Limitation of activity: no Pneumovax: Up to Date Influenza: Up to Date  Relevant past medical, surgical, family and social history reviewed and updated as indicated. Interim medical history since our last visit reviewed. Allergies and medications reviewed and updated.  Review of Systems  Constitutional: Negative.   HENT: Negative.    Respiratory:  Positive for cough, shortness of breath and wheezing. Negative for apnea, choking, chest tightness and stridor.   Cardiovascular: Negative.   Gastrointestinal: Negative.   Neurological: Negative.   Psychiatric/Behavioral: Negative.      Per HPI unless specifically indicated above     Objective:    BP (!) 167/88   Pulse 94   Temp 97.7 F (36.5 C) (Oral)   Wt 256 lb 6.4 oz (116.3 kg)   SpO2 97%   BMI 37.86 kg/m   Wt Readings from Last 3 Encounters:   03/09/23 256 lb 6.4 oz (116.3 kg)  09/05/22 260 lb 6.4 oz (118.1 kg)  09/19/21 249 lb (112.9 kg)    Physical Exam Vitals and nursing note reviewed.  Constitutional:      General: He is not in acute distress.    Appearance: Normal appearance. He is obese. He is not ill-appearing, toxic-appearing or diaphoretic.  HENT:     Head: Normocephalic and atraumatic.     Right Ear: External ear normal.     Left Ear: External ear normal.     Nose: Nose normal.     Mouth/Throat:     Mouth: Mucous membranes are moist.     Pharynx: Oropharynx is clear.  Eyes:     General: No scleral icterus.       Right eye: No discharge.        Left eye: No discharge.     Extraocular Movements: Extraocular movements intact.     Conjunctiva/sclera: Conjunctivae normal.     Pupils: Pupils are equal, round, and reactive to light.  Cardiovascular:     Rate and Rhythm: Normal rate and regular rhythm.     Pulses: Normal pulses.     Heart sounds: Normal heart sounds. No murmur heard.    No friction rub. No gallop.  Pulmonary:     Effort: Pulmonary effort is normal. No respiratory distress.     Breath sounds: Normal breath sounds. No stridor. No wheezing, rhonchi or rales.  Chest:  Chest wall: No tenderness.  Musculoskeletal:        General: Normal range of motion.     Cervical back: Normal range of motion and neck supple.  Skin:    General: Skin is warm and dry.     Capillary Refill: Capillary refill takes less than 2 seconds.     Coloration: Skin is not jaundiced or pale.     Findings: No bruising, erythema, lesion or rash.  Neurological:     General: No focal deficit present.     Mental Status: He is alert and oriented to person, place, and time. Mental status is at baseline.  Psychiatric:        Mood and Affect: Mood normal.        Behavior: Behavior normal.        Thought Content: Thought content normal.        Judgment: Judgment normal.     Results for orders placed or performed in visit on  09/05/22  Urine Culture   Specimen: Urine   UR  Result Value Ref Range   Urine Culture, Routine Final report    Organism ID, Bacteria No growth   Microscopic Examination   Urine  Result Value Ref Range   WBC, UA 0-5 0 - 5 /hpf   RBC, Urine 0-2 0 - 2 /hpf   Epithelial Cells (non renal) 0-10 0 - 10 /hpf   Bacteria, UA None seen None seen/Few  Comprehensive metabolic panel  Result Value Ref Range   Glucose 148 (H) 70 - 99 mg/dL   BUN 13 8 - 27 mg/dL   Creatinine, Ser 8.11 0.76 - 1.27 mg/dL   eGFR 85 >91 YN/WGN/5.62   BUN/Creatinine Ratio 13 10 - 24   Sodium 136 134 - 144 mmol/L   Potassium 4.1 3.5 - 5.2 mmol/L   Chloride 101 96 - 106 mmol/L   CO2 21 20 - 29 mmol/L   Calcium 9.6 8.6 - 10.2 mg/dL   Total Protein 6.7 6.0 - 8.5 g/dL   Albumin 4.3 3.8 - 4.9 g/dL   Globulin, Total 2.4 1.5 - 4.5 g/dL   Albumin/Globulin Ratio 1.8 1.2 - 2.2   Bilirubin Total 0.2 0.0 - 1.2 mg/dL   Alkaline Phosphatase 60 44 - 121 IU/L   AST 20 0 - 40 IU/L   ALT 28 0 - 44 IU/L  CBC with Differential/Platelet  Result Value Ref Range   WBC 8.3 3.4 - 10.8 x10E3/uL   RBC 4.46 4.14 - 5.80 x10E6/uL   Hemoglobin 15.1 13.0 - 17.7 g/dL   Hematocrit 13.0 86.5 - 51.0 %   MCV 99 (H) 79 - 97 fL   MCH 33.9 (H) 26.6 - 33.0 pg   MCHC 34.3 31.5 - 35.7 g/dL   RDW 78.4 69.6 - 29.5 %   Platelets 188 150 - 450 x10E3/uL   Neutrophils 61 Not Estab. %   Lymphs 30 Not Estab. %   Monocytes 6 Not Estab. %   Eos 2 Not Estab. %   Basos 1 Not Estab. %   Neutrophils Absolute 5.0 1.4 - 7.0 x10E3/uL   Lymphocytes Absolute 2.5 0.7 - 3.1 x10E3/uL   Monocytes Absolute 0.5 0.1 - 0.9 x10E3/uL   EOS (ABSOLUTE) 0.2 0.0 - 0.4 x10E3/uL   Basophils Absolute 0.1 0.0 - 0.2 x10E3/uL   Immature Granulocytes 0 Not Estab. %   Immature Grans (Abs) 0.0 0.0 - 0.1 x10E3/uL  Lipid Panel w/o Chol/HDL Ratio  Result Value Ref Range  Cholesterol, Total 169 100 - 199 mg/dL   Triglycerides 132 0 - 149 mg/dL   HDL 43 >44 mg/dL   VLDL  Cholesterol Cal 21 5 - 40 mg/dL   LDL Chol Calc (NIH) 010 (H) 0 - 99 mg/dL  PSA  Result Value Ref Range   Prostate Specific Ag, Serum 1.1 0.0 - 4.0 ng/mL  TSH  Result Value Ref Range   TSH 1.940 0.450 - 4.500 uIU/mL  Urinalysis, Routine w reflex microscopic  Result Value Ref Range   Specific Gravity, UA 1.020 1.005 - 1.030   pH, UA 5.5 5.0 - 7.5   Color, UA Yellow Yellow   Appearance Ur Clear Clear   Leukocytes,UA 1+ (A) Negative   Protein,UA Negative Negative/Trace   Glucose, UA Negative Negative   Ketones, UA Negative Negative   RBC, UA Negative Negative   Bilirubin, UA Negative Negative   Urobilinogen, Ur 0.2 0.2 - 1.0 mg/dL   Nitrite, UA Negative Negative   Microscopic Examination See below:   Microalbumin, Urine Waived  Result Value Ref Range   Microalb, Ur Waived 10 0 - 19 mg/L   Creatinine, Urine Waived 100 10 - 300 mg/dL   Microalb/Creat Ratio <30 <30 mg/g      Assessment & Plan:   Problem List Items Addressed This Visit       Cardiovascular and Mediastinum   HTN (hypertension)    Has not been taking his medicine. Will change to losartan and recheck 1 month. Call with any concerns.       Relevant Medications   losartan-hydrochlorothiazide (HYZAAR) 50-12.5 MG tablet   Other Relevant Orders   Basic metabolic panel   CAD (coronary artery disease) - Primary    Will keep his BP and cholesterol under good control. Continue to monitor. Call with any concerns.       Relevant Medications   losartan-hydrochlorothiazide (HYZAAR) 50-12.5 MG tablet     Respiratory   COPD (chronic obstructive pulmonary disease) (HCC)    Not using his advair. Encouraged him to use his advair as prescribed. Call with any concerns. Recheck 1 month.       Relevant Medications   albuterol (VENTOLIN HFA) 108 (90 Base) MCG/ACT inhaler   fluticasone-salmeterol (WIXELA INHUB) 250-50 MCG/ACT AEPB   Other Relevant Orders   CBC with Differential/Platelet     Other   Depression    Doing  well off medicine. Continue to monitor. Call with any concerns.         Follow up plan: Return in about 4 weeks (around 04/06/2023).

## 2023-03-09 NOTE — Assessment & Plan Note (Signed)
Will keep his BP and cholesterol under good control. Continue to monitor. Call with any concerns.  

## 2023-03-09 NOTE — Assessment & Plan Note (Signed)
Not using his advair. Encouraged him to use his advair as prescribed. Call with any concerns. Recheck 1 month.

## 2023-03-10 ENCOUNTER — Encounter: Payer: Self-pay | Admitting: Family Medicine

## 2023-03-10 LAB — CBC WITH DIFFERENTIAL/PLATELET
Basophils Absolute: 0.1 10*3/uL (ref 0.0–0.2)
Basos: 1 %
EOS (ABSOLUTE): 0.2 10*3/uL (ref 0.0–0.4)
Eos: 2 %
Hematocrit: 45 % (ref 37.5–51.0)
Hemoglobin: 15.4 g/dL (ref 13.0–17.7)
Immature Grans (Abs): 0 10*3/uL (ref 0.0–0.1)
Immature Granulocytes: 0 %
Lymphocytes Absolute: 2.1 10*3/uL (ref 0.7–3.1)
Lymphs: 33 %
MCH: 34.8 pg — ABNORMAL HIGH (ref 26.6–33.0)
MCHC: 34.2 g/dL (ref 31.5–35.7)
MCV: 102 fL — ABNORMAL HIGH (ref 79–97)
Monocytes Absolute: 0.4 10*3/uL (ref 0.1–0.9)
Monocytes: 6 %
Neutrophils Absolute: 3.6 10*3/uL (ref 1.4–7.0)
Neutrophils: 58 %
Platelets: 154 10*3/uL (ref 150–450)
RBC: 4.43 x10E6/uL (ref 4.14–5.80)
RDW: 12.7 % (ref 11.6–15.4)
WBC: 6.3 10*3/uL (ref 3.4–10.8)

## 2023-03-10 LAB — LIPID PANEL W/O CHOL/HDL RATIO
Cholesterol, Total: 161 mg/dL (ref 100–199)
HDL: 41 mg/dL (ref >39)
LDL Chol Calc (NIH): 81 mg/dL (ref 0–99)
Triglycerides: 238 mg/dL — ABNORMAL HIGH (ref 0–149)
VLDL Cholesterol Cal: 39 mg/dL (ref 5–40)

## 2023-03-10 LAB — COMPREHENSIVE METABOLIC PANEL
ALT: 32 IU/L (ref 0–44)
AST: 30 IU/L (ref 0–40)
Albumin: 4.3 g/dL (ref 3.8–4.9)
Alkaline Phosphatase: 70 IU/L (ref 44–121)
BUN/Creatinine Ratio: 11 (ref 10–24)
BUN: 10 mg/dL (ref 8–27)
Bilirubin Total: 0.4 mg/dL (ref 0.0–1.2)
CO2: 23 mmol/L (ref 20–29)
Calcium: 9 mg/dL (ref 8.6–10.2)
Chloride: 100 mmol/L (ref 96–106)
Creatinine, Ser: 0.95 mg/dL (ref 0.76–1.27)
Globulin, Total: 2.5 g/dL (ref 1.5–4.5)
Glucose: 211 mg/dL — ABNORMAL HIGH (ref 70–99)
Potassium: 3.7 mmol/L (ref 3.5–5.2)
Sodium: 140 mmol/L (ref 134–144)
Total Protein: 6.8 g/dL (ref 6.0–8.5)
eGFR: 92 mL/min/{1.73_m2} (ref >59)

## 2023-03-10 NOTE — Telephone Encounter (Signed)
Requested medication (s) are due for refill today: yes  Requested medication (s) are on the active medication list: yes  Last refill:  01/09/23 #90/1   Future visit scheduled: yes  Notes to clinic:  Unable to refill per protocol, cannot delegate.    Requested Prescriptions  Pending Prescriptions Disp Refills   tiZANidine (ZANAFLEX) 4 MG tablet [Pharmacy Med Name: TIZANIDINE HCL 4 MG TABLET] 90 tablet 1    Sig: TAKE 1 TABLET BY MOUTH EVERY 6 HOURS AS NEEDED FOR MUSCLE SPASMS.     Not Delegated - Cardiovascular:  Alpha-2 Agonists - tizanidine Failed - 03/09/2023  2:42 AM      Failed - This refill cannot be delegated      Failed - Valid encounter within last 6 months    Recent Outpatient Visits           Yesterday Coronary artery disease involving native coronary artery of native heart without angina pectoris   [place] [facility] [place], Megan P, DO   6 months ago Routine general medical examination at a health care facility   [facility] [place], [place] P, DO   1 year ago Routine general medical examination at a health care facility   [facility] [place], [place] P, DO   2 years ago RUQ pain   [place] [facility] [place], [place] P, DO   2 years ago Osteoarthritis of spine with radiculopathy, lumbar region   [facility] Dorcas Carrow, DO       Future Appointments             In 1 month Laural Benes, Oralia Rud, DO [facility]

## 2023-04-23 ENCOUNTER — Ambulatory Visit (INDEPENDENT_AMBULATORY_CARE_PROVIDER_SITE_OTHER): Payer: Medicaid Other | Admitting: Family Medicine

## 2023-04-23 ENCOUNTER — Encounter: Payer: Self-pay | Admitting: Family Medicine

## 2023-04-23 VITALS — BP 150/82 | HR 74 | Temp 98.2°F | Wt 257.8 lb

## 2023-04-23 DIAGNOSIS — R739 Hyperglycemia, unspecified: Secondary | ICD-10-CM | POA: Diagnosis not present

## 2023-04-23 DIAGNOSIS — I10 Essential (primary) hypertension: Secondary | ICD-10-CM | POA: Diagnosis not present

## 2023-04-23 DIAGNOSIS — E1165 Type 2 diabetes mellitus with hyperglycemia: Secondary | ICD-10-CM

## 2023-04-23 DIAGNOSIS — E119 Type 2 diabetes mellitus without complications: Secondary | ICD-10-CM | POA: Insufficient documentation

## 2023-04-23 DIAGNOSIS — E785 Hyperlipidemia, unspecified: Secondary | ICD-10-CM | POA: Diagnosis not present

## 2023-04-23 DIAGNOSIS — E118 Type 2 diabetes mellitus with unspecified complications: Secondary | ICD-10-CM | POA: Insufficient documentation

## 2023-04-23 LAB — BAYER DCA HB A1C WAIVED: HB A1C (BAYER DCA - WAIVED): 7.4 % — ABNORMAL HIGH (ref 4.8–5.6)

## 2023-04-23 MED ORDER — LOSARTAN POTASSIUM-HCTZ 100-25 MG PO TABS: 1.0000 | ORAL_TABLET | Freq: Every day | ORAL | 1 refills | Status: DC

## 2023-04-23 MED ORDER — ATORVASTATIN CALCIUM 40 MG PO TABS
40.0000 mg | ORAL_TABLET | ORAL | 0 refills | Status: DC
Start: 2023-04-23 — End: 2024-01-26

## 2023-04-23 MED ORDER — OZEMPIC (0.25 OR 0.5 MG/DOSE) 2 MG/3ML ~~LOC~~ SOPN: 0.2500 mg | PEN_INJECTOR | SUBCUTANEOUS | 1 refills | Status: DC

## 2023-04-23 MED ORDER — OZEMPIC (0.25 OR 0.5 MG/DOSE) 2 MG/3ML ~~LOC~~ SOPN: 0.2500 mg | PEN_INJECTOR | SUBCUTANEOUS | Status: DC

## 2023-04-23 NOTE — Progress Notes (Signed)
BP (!) 150/82   Pulse 74   Temp 98.2 F (36.8 C) (Oral)   Wt 257 lb 12.8 oz (116.9 kg)   SpO2 94%   BMI 38.07 kg/m    Subjective:    Patient ID: Jimmy Simmons, male    DOB: Jan 19, 1961, 62 y.o.   MRN: [id number]  HPI: Jimmy Simmons is a 62 y.o. male  Chief Complaint  Patient presents with   Hypertension   HYPERTENSION  Hypertension status: better  Satisfied with current treatment? no Duration of hypertension: chronic BP monitoring frequency:  not checking BP medication side effects:  no Medication compliance: excellent compliance Previous BP meds:losartan-HCTZ Aspirin: no Recurrent headaches: no Visual changes: no Palpitations: no Dyspnea: no Chest pain: no Lower extremity edema: no Dizzy/lightheaded: no  HYPERLIPIDEMIA Hyperlipidemia status: uncontrolled Satisfied with current treatment?  yes Side effects:  N/A Past cholesterol meds: none Supplements: none Aspirin:  no The 10-year ASCVD risk score (Arnett DK, et al., 2019) is: 34.2%   Values used to calculate the score:     Age: 62 years     Sex: Male     Is Non-Hispanic African American: No     Diabetic: Yes     Tobacco smoker: Yes     Systolic Blood Pressure: 150 mmHg     Is BP treated: Yes     HDL Cholesterol: 41 mg/dL     Total Cholesterol: 161 mg/dL Chest pain:  no Coronary artery disease:  yes  Relevant past medical, surgical, family and social history reviewed and updated as indicated. Interim medical history since our last visit reviewed. Allergies and medications reviewed and updated.  Review of Systems  Constitutional: Negative.   Respiratory: Negative.    Cardiovascular: Negative.   Gastrointestinal: Negative.   Musculoskeletal: Negative.   Neurological: Negative.   Psychiatric/Behavioral: Negative.      Per HPI unless specifically indicated above     Objective:    BP (!) 150/82   Pulse 74   Temp 98.2 F (36.8 C) (Oral)   Wt 257 lb 12.8 oz (116.9 kg)   SpO2 94%   BMI 38.07 kg/m    Wt Readings from Last 3 Encounters:  04/23/23 257 lb 12.8 oz (116.9 kg)  03/09/23 256 lb 6.4 oz (116.3 kg)  09/05/22 260 lb 6.4 oz (118.1 kg)    Physical Exam Vitals and nursing note reviewed.  Constitutional:      General: He is not in acute distress.    Appearance: Normal appearance. He is obese. He is not ill-appearing, toxic-appearing or diaphoretic.  HENT:     Head: Normocephalic and atraumatic.     Right Ear: External ear normal.     Left Ear: External ear normal.     Nose: Nose normal.     Mouth/Throat:     Mouth: Mucous membranes are moist.     Pharynx: Oropharynx is clear.  Eyes:     General: No scleral icterus.       Right eye: No discharge.        Left eye: No discharge.     Extraocular Movements: Extraocular movements intact.     Conjunctiva/sclera: Conjunctivae normal.     Pupils: Pupils are equal, round, and reactive to light.  Cardiovascular:     Rate and Rhythm: Normal rate and regular rhythm.     Pulses: Normal pulses.     Heart sounds: Normal heart sounds. No murmur heard.    No friction rub. No gallop.  Pulmonary:  Effort: Pulmonary effort is normal. No respiratory distress.     Breath sounds: Normal breath sounds. No stridor. No wheezing, rhonchi or rales.  Chest:     Chest wall: No tenderness.  Musculoskeletal:        General: Normal range of motion.     Cervical back: Normal range of motion and neck supple.  Skin:    General: Skin is warm and dry.     Capillary Refill: Capillary refill takes less than 2 seconds.     Coloration: Skin is not jaundiced or pale.     Findings: No bruising, erythema, lesion or rash.  Neurological:     General: No focal deficit present.     Mental Status: He is alert and oriented to person, place, and time. Mental status is at baseline.  Psychiatric:        Mood and Affect: Mood normal.        Behavior: Behavior normal.        Thought Content: Thought content normal.        Judgment: Judgment normal.      Results for orders placed or performed in visit on 04/23/23  Bayer DCA Hb A1c Waived  Result Value Ref Range   HB A1C (BAYER DCA - WAIVED) 7.4 (H) 4.8 - 5.6 %      Assessment & Plan:   Problem List Items Addressed This Visit       Cardiovascular and Mediastinum   HTN (hypertension) - Primary    Better, but still running high. Will increase his losartan-hydrochlorothiazide to 100/25 and recheck in about 6 weeks. Call with any concerns. Continue to monitor.       Relevant Medications   losartan-hydrochlorothiazide (HYZAAR) 100-25 MG tablet   atorvastatin (LIPITOR) 40 MG tablet   Other Relevant Orders   Basic metabolic panel     Endocrine   Controlled type 2 diabetes mellitus with complication, without long-term current use of insulin (HCC)    Newly diagnosed today with A1c of 7.4. Will start ozempic and recheck in about 6 weeks. Call with any concerns. Continue to monitor. Referral to nutrition and eye doctor made today.      Relevant Medications   losartan-hydrochlorothiazide (HYZAAR) 100-25 MG tablet   atorvastatin (LIPITOR) 40 MG tablet   Semaglutide,0.25 or 0.5MG /DOS, (OZEMPIC, 0.25 OR 0.5 MG/DOSE,) 2 MG/3ML SOPN   Semaglutide,0.25 or 0.5MG /DOS, (OZEMPIC, 0.25 OR 0.5 MG/DOSE,) 2 MG/3ML SOPN   Other Relevant Orders   Ambulatory referral to Ophthalmology   Amb ref to Medical Nutrition Therapy-MNT     Other   Hyperlipidemia    Will start weekly atorvastatin. Rechecking labs today. Await results.       Relevant Medications   losartan-hydrochlorothiazide (HYZAAR) 100-25 MG tablet   atorvastatin (LIPITOR) 40 MG tablet   Other Relevant Orders   Lipid Panel w/o Chol/HDL Ratio   Other Visit Diagnoses     Hyperglycemia       Rechecking labs today. Await results. Treat as needed.   Relevant Orders   Bayer DCA Hb A1c Waived (Completed)        Follow up plan: Return in about 6 weeks (around 06/04/2023).

## 2023-04-23 NOTE — Assessment & Plan Note (Signed)
Better, but still running high. Will increase his losartan-hydrochlorothiazide to 100/25 and recheck in about 6 weeks. Call with any concerns. Continue to monitor.

## 2023-04-23 NOTE — Assessment & Plan Note (Signed)
Will start weekly atorvastatin. Rechecking labs today. Await results.

## 2023-04-23 NOTE — Assessment & Plan Note (Signed)
Newly diagnosed today with A1c of 7.4. Will start ozempic and recheck in about 6 weeks. Call with any concerns. Continue to monitor. Referral to nutrition and eye doctor made today.

## 2023-04-24 ENCOUNTER — Encounter: Payer: Self-pay | Admitting: Family Medicine

## 2023-04-24 LAB — BASIC METABOLIC PANEL
BUN/Creatinine Ratio: 11 (ref 10–24)
BUN: 11 mg/dL (ref 8–27)
CO2: 24 mmol/L (ref 20–29)
Calcium: 9.7 mg/dL (ref 8.6–10.2)
Chloride: 98 mmol/L (ref 96–106)
Creatinine, Ser: 0.98 mg/dL (ref 0.76–1.27)
Glucose: 148 mg/dL — ABNORMAL HIGH (ref 70–99)
Potassium: 3.8 mmol/L (ref 3.5–5.2)
Sodium: 139 mmol/L (ref 134–144)
eGFR: 88 mL/min/{1.73_m2} (ref >59)

## 2023-04-24 LAB — LIPID PANEL W/O CHOL/HDL RATIO
Cholesterol, Total: 181 mg/dL (ref 100–199)
HDL: 50 mg/dL (ref >39)
LDL Chol Calc (NIH): 109 mg/dL — ABNORMAL HIGH (ref 0–99)
Triglycerides: 122 mg/dL (ref 0–149)
VLDL Cholesterol Cal: 22 mg/dL (ref 5–40)

## 2023-05-16 ENCOUNTER — Other Ambulatory Visit: Payer: Self-pay | Admitting: Family Medicine

## 2023-05-18 NOTE — Telephone Encounter (Signed)
Requested medications are due for refill today.  no  Requested medications are on the active medications list.  yes  Last refill. 04/23/2023 #30 1 rf  Future visit scheduled.   yes  Notes to clinic.  90 day prescription is requested.    Requested Prescriptions  Pending Prescriptions Disp Refills   losartan-hydrochlorothiazide (HYZAAR) 100-25 MG tablet [Pharmacy Med Name: LOSARTAN-HCTZ 100-25 MG TAB] 90 tablet 1    Sig: TAKE 1 TABLET BY MOUTH EVERY DAY     Cardiovascular: ARB + Diuretic Combos Failed - 05/16/2023  4:59 PM      Failed - Last BP in normal range    BP Readings from Last 1 Encounters:  04/23/23 (!) 150/82         Passed - K in normal range and within 180 days    Potassium  Date Value Ref Range Status  04/23/2023 3.8 3.5 - 5.2 mmol/L Final         Passed - Na in normal range and within 180 days    Sodium  Date Value Ref Range Status  04/23/2023 139 134 - 144 mmol/L Final         Passed - Cr in normal range and within 180 days    Creatinine, Ser  Date Value Ref Range Status  04/23/2023 0.98 0.76 - 1.27 mg/dL Final         Passed - eGFR is 10 or above and within 180 days    GFR calc Af Amer  Date Value Ref Range Status  01/05/2020 85 >59 mL/min/1.73 Final    Comment:    **[facility] currently reports eGFR in compliance with the current**   recommendations of the [employer]. [facility] will   update reporting as new guidelines are published from the NKF-ASN   Task force.    GFR calc non Af Amer  Date Value Ref Range Status  01/05/2020 73 >59 mL/min/1.73 Final   eGFR  Date Value Ref Range Status  04/23/2023 88 >59 mL/min/1.73 Final         Passed - Patient is not pregnant      Passed - Valid encounter within last 6 months    Recent Outpatient Visits           3 weeks ago Primary hypertension   [place] [facility] [place], Megan P, DO   2 months ago Coronary artery disease involving native coronary artery of native  heart without angina pectoris   [place] [facility] [place], Megan P, DO   8 months ago Routine general medical examination at a health care facility   [facility] [place], [place] P, DO   1 year ago Routine general medical examination at a health care facility   [facility] [place], [place] P, DO   2 years ago RUQ pain   [place] [facility] [place], Oralia Rud, DO       Future Appointments             In 3 weeks Laural Benes, Oralia Rud, DO [place] [facility]

## 2023-05-26 ENCOUNTER — Ambulatory Visit: Payer: Medicaid Other | Admitting: Dietician

## 2023-06-08 ENCOUNTER — Other Ambulatory Visit: Payer: Self-pay | Admitting: Family Medicine

## 2023-06-09 NOTE — Telephone Encounter (Signed)
Requested Prescriptions  Refused Prescriptions Disp Refills   losartan-hydrochlorothiazide (HYZAAR) 50-12.5 MG tablet [Pharmacy Med Name: LOSARTAN-HCTZ 50-12.5 MG TAB] 90 tablet     Sig: TAKE 1 TABLET BY MOUTH EVERY DAY     Cardiovascular: ARB + Diuretic Combos Failed - 06/08/2023  1:26 AM      Failed - Last BP in normal range    BP Readings from Last 1 Encounters:  04/23/23 (!) 150/82         Passed - K in normal range and within 180 days    Potassium  Date Value Ref Range Status  04/23/2023 3.8 3.5 - 5.2 mmol/L Final         Passed - Na in normal range and within 180 days    Sodium  Date Value Ref Range Status  04/23/2023 139 134 - 144 mmol/L Final         Passed - Cr in normal range and within 180 days    Creatinine, Ser  Date Value Ref Range Status  04/23/2023 0.98 0.76 - 1.27 mg/dL Final         Passed - eGFR is 10 or above and within 180 days    GFR calc Af Amer  Date Value Ref Range Status  01/05/2020 85 >59 mL/min/1.73 Final    Comment:    **[facility] currently reports eGFR in compliance with the current**   recommendations of the [employer]. [facility] will   update reporting as new guidelines are published from the NKF-ASN   Task force.    GFR calc non Af Amer  Date Value Ref Range Status  01/05/2020 73 >59 mL/min/1.73 Final   eGFR  Date Value Ref Range Status  04/23/2023 88 >59 mL/min/1.73 Final         Passed - Patient is not pregnant      Passed - Valid encounter within last 6 months    Recent Outpatient Visits           1 month ago Primary hypertension   [place] [facility] [place], Megan P, DO   3 months ago Coronary artery disease involving native coronary artery of native heart without angina pectoris   [place] [facility] [place], Megan P, DO   9 months ago Routine general medical examination at a health care facility   [facility] [place], [place] P, DO   1 year ago  Routine general medical examination at a health care facility   [facility] [place], [place] P, DO   2 years ago RUQ pain   [place] [facility] [place], Oralia Rud, DO       Future Appointments             In 2 days Dorcas Carrow, DO [facility]

## 2023-06-11 ENCOUNTER — Ambulatory Visit (INDEPENDENT_AMBULATORY_CARE_PROVIDER_SITE_OTHER): Payer: Medicaid Other | Admitting: Family Medicine

## 2023-06-11 ENCOUNTER — Encounter: Payer: Self-pay | Admitting: Family Medicine

## 2023-06-11 VITALS — BP 121/63 | HR 83 | Ht 69.0 in | Wt 253.0 lb

## 2023-06-11 DIAGNOSIS — E782 Mixed hyperlipidemia: Secondary | ICD-10-CM | POA: Diagnosis not present

## 2023-06-11 DIAGNOSIS — I10 Essential (primary) hypertension: Secondary | ICD-10-CM | POA: Diagnosis not present

## 2023-06-11 DIAGNOSIS — Z7985 Long-term (current) use of injectable non-insulin antidiabetic drugs: Secondary | ICD-10-CM

## 2023-06-11 DIAGNOSIS — E118 Type 2 diabetes mellitus with unspecified complications: Secondary | ICD-10-CM

## 2023-06-11 MED ORDER — LOSARTAN POTASSIUM-HCTZ 100-25 MG PO TABS: 1.0000 | ORAL_TABLET | Freq: Every day | ORAL | 1 refills | Status: DC

## 2023-06-11 NOTE — Assessment & Plan Note (Signed)
Under good control on current regimen. Continue current regimen. Continue to monitor. Call with any concerns. Refills given. Labs drawn today.   

## 2023-06-11 NOTE — Assessment & Plan Note (Signed)
Under good control on current regimen. Continue current regimen. Continue to monitor. Call with any concerns. Refills given. Labs to be checked next visit.

## 2023-06-11 NOTE — Assessment & Plan Note (Signed)
Tolerating ozempic well. Will recheck A1c and adjust medicine as needed. Call with any concerns.

## 2023-06-11 NOTE — Progress Notes (Signed)
BP 121/63   Pulse 83   Ht 5\' 9"  (1.753 m)   Wt 253 lb (114.8 kg)   SpO2 95%   BMI 37.36 kg/m    Subjective:    Patient ID: Jimmy Simmons, male    DOB: 11-Dec-1960, 62 y.o.   MRN: [id number]  HPI: Jimmy Simmons is a 62 y.o. male  Chief Complaint  Patient presents with   Hypertension   Diabetes   Hyperlipidemia   DIABETES Hypoglycemic episodes:no Polydipsia/polyuria: no Visual disturbance: no Chest pain: no Paresthesias: no Glucose Monitoring: no  Accucheck frequency: Not Checking Taking Insulin?: no Blood Pressure Monitoring: not checking Retinal Examination: Not up to Date Foot Exam: Up to Date Diabetic Education: Not Completed Pneumovax: Up to Date Influenza: Not up to Date Aspirin: no  HYPERTENSION / HYPERLIPIDEMIA Satisfied with current treatment? yes Duration of hypertension: chronic BP monitoring frequency: not checking BP medication side effects: no Past BP meds: losartan-HCTZ Duration of hyperlipidemia: chronic Cholesterol medication side effects: no Cholesterol supplements: none Past cholesterol medications:  Medication compliance: excellent compliance Aspirin: no Recent stressors: no Recurrent headaches: no Visual changes: no Palpitations: no Dyspnea: no Chest pain: no Lower extremity edema: no Dizzy/lightheaded: no   Relevant past medical, surgical, family and social history reviewed and updated as indicated. Interim medical history since our last visit reviewed. Allergies and medications reviewed and updated.  Review of Systems  Constitutional: Negative.   Respiratory: Negative.    Cardiovascular: Negative.   Gastrointestinal: Negative.   Musculoskeletal: Negative.   Skin: Negative.   Psychiatric/Behavioral: Negative.      Per HPI unless specifically indicated above     Objective:    BP 121/63   Pulse 83   Ht 5\' 9"  (1.753 m)   Wt 253 lb (114.8 kg)   SpO2 95%   BMI 37.36 kg/m   Wt Readings from Last 3 Encounters:  06/11/23 253  lb (114.8 kg)  04/23/23 257 lb 12.8 oz (116.9 kg)  03/09/23 256 lb 6.4 oz (116.3 kg)    Physical Exam Vitals and nursing note reviewed.  Constitutional:      General: He is not in acute distress.    Appearance: Normal appearance. He is not ill-appearing, toxic-appearing or diaphoretic.  HENT:     Head: Normocephalic and atraumatic.     Right Ear: External ear normal.     Left Ear: External ear normal.     Nose: Nose normal.     Mouth/Throat:     Mouth: Mucous membranes are moist.     Pharynx: Oropharynx is clear.  Eyes:     General: No scleral icterus.       Right eye: No discharge.        Left eye: No discharge.     Extraocular Movements: Extraocular movements intact.     Conjunctiva/sclera: Conjunctivae normal.     Pupils: Pupils are equal, round, and reactive to light.  Cardiovascular:     Rate and Rhythm: Normal rate and regular rhythm.     Pulses: Normal pulses.     Heart sounds: Normal heart sounds. No murmur heard.    No friction rub. No gallop.  Pulmonary:     Effort: Pulmonary effort is normal. No respiratory distress.     Breath sounds: Normal breath sounds. No stridor. No wheezing, rhonchi or rales.  Chest:     Chest wall: No tenderness.  Musculoskeletal:        General: Normal range of motion.     Cervical  back: Normal range of motion and neck supple.  Skin:    General: Skin is warm and dry.     Capillary Refill: Capillary refill takes less than 2 seconds.     Coloration: Skin is not jaundiced or pale.     Findings: No bruising, erythema, lesion or rash.  Neurological:     General: No focal deficit present.     Mental Status: He is alert and oriented to person, place, and time. Mental status is at baseline.  Psychiatric:        Mood and Affect: Mood normal.        Behavior: Behavior normal.        Thought Content: Thought content normal.        Judgment: Judgment normal.     Results for orders placed or performed in visit on 04/23/23  Bayer DCA Hb A1c  Waived  Result Value Ref Range   HB A1C (BAYER DCA - WAIVED) 7.4 (H) 4.8 - 5.6 %  Basic metabolic panel  Result Value Ref Range   Glucose 148 (H) 70 - 99 mg/dL   BUN 11 8 - 27 mg/dL   Creatinine, Ser 1.61 0.76 - 1.27 mg/dL   eGFR 88 >09 UE/AVW/0.98   BUN/Creatinine Ratio 11 10 - 24   Sodium 139 134 - 144 mmol/L   Potassium 3.8 3.5 - 5.2 mmol/L   Chloride 98 96 - 106 mmol/L   CO2 24 20 - 29 mmol/L   Calcium 9.7 8.6 - 10.2 mg/dL  Lipid Panel w/o Chol/HDL Ratio  Result Value Ref Range   Cholesterol, Total 181 100 - 199 mg/dL   Triglycerides 119 0 - 149 mg/dL   HDL 50 >14 mg/dL   VLDL Cholesterol Cal 22 5 - 40 mg/dL   LDL Chol Calc (NIH) 782 (H) 0 - 99 mg/dL      Assessment & Plan:   Problem List Items Addressed This Visit       Cardiovascular and Mediastinum   HTN (hypertension)    Under good control on current regimen. Continue current regimen. Continue to monitor. Call with any concerns. Refills given. Labs drawn today.       Relevant Orders   Basic metabolic panel     Endocrine   Controlled type 2 diabetes mellitus with complication, without long-term current use of insulin (HCC) - Primary    Tolerating ozempic well. Will recheck A1c and adjust medicine as needed. Call with any concerns.         Other   Hyperlipidemia    Under good control on current regimen. Continue current regimen. Continue to monitor. Call with any concerns. Refills given. Labs to be checked next visit.          Follow up plan: Return in about 6 weeks (around 07/23/2023).

## 2023-06-12 ENCOUNTER — Other Ambulatory Visit: Payer: Self-pay | Admitting: Family Medicine

## 2023-06-12 DIAGNOSIS — E876 Hypokalemia: Secondary | ICD-10-CM

## 2023-06-12 LAB — BASIC METABOLIC PANEL
BUN/Creatinine Ratio: 15 (ref 10–24)
BUN: 13 mg/dL (ref 8–27)
CO2: 21 mmol/L (ref 20–29)
Calcium: 9.9 mg/dL (ref 8.6–10.2)
Chloride: 96 mmol/L (ref 96–106)
Creatinine, Ser: 0.89 mg/dL (ref 0.76–1.27)
Glucose: 131 mg/dL — ABNORMAL HIGH (ref 70–99)
Potassium: 3 mmol/L — ABNORMAL LOW (ref 3.5–5.2)
Sodium: 137 mmol/L (ref 134–144)
eGFR: 97 mL/min/{1.73_m2} (ref >59)

## 2023-06-25 ENCOUNTER — Ambulatory Visit (INDEPENDENT_AMBULATORY_CARE_PROVIDER_SITE_OTHER): Payer: Medicaid Other | Admitting: Family Medicine

## 2023-06-25 ENCOUNTER — Encounter: Payer: Self-pay | Admitting: Family Medicine

## 2023-06-25 ENCOUNTER — Other Ambulatory Visit: Payer: Medicaid Other

## 2023-06-25 VITALS — BP 138/79 | HR 98 | Ht 69.0 in | Wt 255.6 lb

## 2023-06-25 DIAGNOSIS — R21 Rash and other nonspecific skin eruption: Secondary | ICD-10-CM

## 2023-06-25 DIAGNOSIS — E876 Hypokalemia: Secondary | ICD-10-CM

## 2023-06-25 MED ORDER — PREDNISONE 10 MG PO TABS: ORAL_TABLET | ORAL | 0 refills | Status: DC

## 2023-06-25 MED ORDER — TRIAMCINOLONE ACETONIDE 40 MG/ML IJ SUSP
40.0000 mg | Freq: Once | INTRAMUSCULAR | Status: AC
Start: 2023-06-25 — End: 2023-06-25
  Administered 2023-06-25: 40 mg via INTRAMUSCULAR

## 2023-06-25 NOTE — Progress Notes (Signed)
BP 138/79 (BP Location: Left Arm, Cuff Size: Normal)   Pulse 98   Ht 5\' 9"  (1.753 m)   Wt 255 lb 9.6 oz (115.9 kg)   SpO2 98%   BMI 37.75 kg/m    Subjective:    Patient ID: Jimmy Simmons, male    DOB: [birth year], 62 y.o.   MRN: [id number]  HPI: Jimmy Simmons is a 62 y.o. male  Chief Complaint  Patient presents with   Allergic Reaction    Patient says he has had some changes to his current medication list. Patient says he has noticed that he has some itching all over his body and says nothing has changed within his laundry detergents. Patient says the only difference he starting taking a Multi-Vitamin for about 5 days and noticed the itching and then stopped the medication, but says the itching has continued. Patient says he feels like it is internal itching. Patient says it is worse at night time.    Skin Problem   RASH Duration:  6 days  Location: generalized  Itching: yes Burning: no Redness: yes Oozing: no Scaling: no Blisters: no Painful: no Fevers: no Change in detergents/soaps/personal care products: no Recent illness: no Recent travel:no History of same: no Context: stable Alleviating factors: none Treatments attempted:stopped the multivitamin, nothing else Shortness of breath: no  Throat/tongue swelling: no Myalgias/arthralgias: no  Relevant past medical, surgical, family and social history reviewed and updated as indicated. Interim medical history since our last visit reviewed. Allergies and medications reviewed and updated.  Review of Systems  Constitutional: Negative.   Respiratory: Negative.    Cardiovascular: Negative.   Gastrointestinal: Negative.   Skin:  Positive for rash. Negative for color change, pallor and wound.  Psychiatric/Behavioral: Negative.      Per HPI unless specifically indicated above     Objective:    BP 138/79 (BP Location: Left Arm, Cuff Size: Normal)   Pulse 98   Ht 5\' 9"  (1.753 m)   Wt 255 lb 9.6 oz (115.9 kg)   SpO2 98%    BMI 37.75 kg/m   Wt Readings from Last 3 Encounters:  06/25/23 255 lb 9.6 oz (115.9 kg)  06/11/23 253 lb (114.8 kg)  04/23/23 257 lb 12.8 oz (116.9 kg)    Physical Exam Vitals and nursing note reviewed.  Constitutional:      General: He is not in acute distress.    Appearance: Normal appearance. He is obese. He is not ill-appearing, toxic-appearing or diaphoretic.  HENT:     Head: Normocephalic and atraumatic.     Right Ear: External ear normal.     Left Ear: External ear normal.     Nose: Nose normal.     Mouth/Throat:     Mouth: Mucous membranes are moist.     Pharynx: Oropharynx is clear.  Eyes:     General: No scleral icterus.       Right eye: No discharge.        Left eye: No discharge.     Extraocular Movements: Extraocular movements intact.     Conjunctiva/sclera: Conjunctivae normal.     Pupils: Pupils are equal, round, and reactive to light.  Cardiovascular:     Rate and Rhythm: Normal rate and regular rhythm.     Pulses: Normal pulses.     Heart sounds: Normal heart sounds. No murmur heard.    No friction rub. No gallop.  Pulmonary:     Effort: Pulmonary effort is normal. No respiratory distress.  Breath sounds: Normal breath sounds. No stridor. No wheezing, rhonchi or rales.  Chest:     Chest wall: No tenderness.  Musculoskeletal:        General: Normal range of motion.     Cervical back: Normal range of motion and neck supple.  Skin:    General: Skin is warm and dry.     Capillary Refill: Capillary refill takes less than 2 seconds.     Coloration: Skin is not jaundiced or pale.     Findings: Rash (raised erythematous papules on chest abdomen and back and arms) present. No bruising, erythema or lesion.  Neurological:     General: No focal deficit present.     Mental Status: He is alert and oriented to person, place, and time. Mental status is at baseline.  Psychiatric:        Mood and Affect: Mood normal.        Behavior: Behavior normal.         Thought Content: Thought content normal.        Judgment: Judgment normal.     Results for orders placed or performed in visit on 06/11/23  Basic metabolic panel  Result Value Ref Range   Glucose 131 (H) 70 - 99 mg/dL   BUN 13 8 - 27 mg/dL   Creatinine, Ser 2.95 0.76 - 1.27 mg/dL   eGFR 97 >62 ZH/YQM/5.78   BUN/Creatinine Ratio 15 10 - 24   Sodium 137 134 - 144 mmol/L   Potassium 3.0 (L) 3.5 - 5.2 mmol/L   Chloride 96 96 - 106 mmol/L   CO2 21 20 - 29 mmol/L   Calcium 9.9 8.6 - 10.2 mg/dL      Assessment & Plan:   Problem List Items Addressed This Visit   None Visit Diagnoses     Rash    -  Primary   Unclear if contact derm or bug bites. Will treat with prednisone taper- if not improving consider bug bites. Call if not getting better. Follow up as scheduled.   Relevant Medications   triamcinolone acetonide (KENALOG-40) injection 40 mg        Follow up plan: Return as scheduled.

## 2023-06-26 ENCOUNTER — Other Ambulatory Visit: Payer: Medicaid Other

## 2023-06-26 LAB — BASIC METABOLIC PANEL
BUN/Creatinine Ratio: 11 (ref 10–24)
BUN: 12 mg/dL (ref 8–27)
CO2: 22 mmol/L (ref 20–29)
Calcium: 9.5 mg/dL (ref 8.6–10.2)
Chloride: 94 mmol/L — ABNORMAL LOW (ref 96–106)
Creatinine, Ser: 1.06 mg/dL (ref 0.76–1.27)
Glucose: 239 mg/dL — ABNORMAL HIGH (ref 70–99)
Potassium: 3.3 mmol/L — ABNORMAL LOW (ref 3.5–5.2)
Sodium: 136 mmol/L (ref 134–144)
eGFR: 80 mL/min/{1.73_m2} (ref >59)

## 2023-06-29 ENCOUNTER — Other Ambulatory Visit: Payer: Self-pay | Admitting: Family Medicine

## 2023-06-29 DIAGNOSIS — E876 Hypokalemia: Secondary | ICD-10-CM

## 2023-07-09 ENCOUNTER — Telehealth: Payer: Self-pay | Admitting: Family Medicine

## 2023-07-09 NOTE — Telephone Encounter (Signed)
Copied from CRM [id number]. Topic: Referral - Request for Referral >> Jul 09, 2023 11:39 AM Franchot Heidelberg wrote: Has patient seen PCP for this complaint? No. *If NO, is insurance requiring patient see PCP for this issue before PCP can refer them? Referral for which specialty: Dermatology  Preferred provider/office: [facility] Reason for referral: Pt says he is itching

## 2023-07-17 ENCOUNTER — Telehealth: Payer: Self-pay | Admitting: Family Medicine

## 2023-07-17 DIAGNOSIS — R21 Rash and other nonspecific skin eruption: Secondary | ICD-10-CM

## 2023-07-17 NOTE — Telephone Encounter (Signed)
Patient was last seen for skin irritation on 06/25/2023

## 2023-07-17 NOTE — Telephone Encounter (Signed)
Copied from CRM [id number]. Topic: Referral - Request for Referral >> Jul 17, 2023  9:48 AM Patsy Lager T wrote: Did the patient discuss referral with their provider in the last year? Yes  Appointment offered? No  Type of order/referral and detailed reason for visit: Dermatologist  Preference of office, provider, location: [facility] Dermatologist  If referral order, have you been seen by this specialty before? Yes, this issue  Can we respond through MyChart? No

## 2023-07-19 ENCOUNTER — Other Ambulatory Visit: Payer: Self-pay | Admitting: Family Medicine

## 2023-07-21 ENCOUNTER — Ambulatory Visit: Payer: Medicaid Other | Admitting: Family Medicine

## 2023-07-21 ENCOUNTER — Ambulatory Visit: Payer: Self-pay

## 2023-07-21 NOTE — Telephone Encounter (Signed)
Noted, was told to send nurse triages no matter what.

## 2023-07-21 NOTE — Telephone Encounter (Signed)
Chief Complaint: Abdominal pain Symptoms: moderate abdominal pain, 3 episodes of diarrhea, nausea Frequency: comes and goes  Pertinent Negatives: Patient denies chest pain, fever, vomiting  Disposition: [] ED /[] Urgent Care (no appt availability in office) / [x] Appointment(In office/virtual)/ []  [facility]/ [] Home Care/ [] Refused Recommended Disposition /[] [facility]/ []  Follow-up with PCP Additional Notes: Patient stated he woke up this morning with abdominal pain near his belly button and radiates down to the lower abdominal area. Patient stated he has had 3 episodes of loose watery stools today and that is not normal for him. Patient did report partner recently tested positive for Hep A. Care advice was given and patient has been scheduled for an appointment today in office at 1600.  Reason for Disposition  [1] MILD-MODERATE pain AND [2] constant AND [3] present > 2 hours  Answer Assessment - Initial Assessment Questions 1. LOCATION: "Where does it hurt?"      Near my belly button  2. RADIATION: "Does the pain shoot anywhere else?" (e.g., chest, back)     Down to the lower abdominal area  3. ONSET: "When did the pain begin?" (Minutes, hours or days ago)      Today 4. SUDDEN: "Gradual or sudden onset?"     Sudden 5. PATTERN "Does the pain come and go, or is it constant?"    - If it comes and goes: "How long does it last?" "Do you have pain now?"     (Note: Comes and goes means the pain is intermittent. It goes away completely between bouts.)    - If constant: "Is it getting better, staying the same, or getting worse?"      (Note: Constant means the pain never goes away completely; most serious pain is constant and gets worse.)      Comes and goes  6. SEVERITY: "How bad is the pain?"  (e.g., Scale 1-10; mild, moderate, or severe)    - MILD (1-3): Doesn't interfere with normal activities, abdomen soft and not tender to touch.     - MODERATE (4-7): Interferes with  normal activities or awakens from sleep, abdomen tender to touch.     - SEVERE (8-10): Excruciating pain, doubled over, unable to do any normal activities.       5/10 7. RECURRENT SYMPTOM: "Have you ever had this type of stomach pain before?" If Yes, ask: "When was the last time?" and "What happened that time?"      No  8. CAUSE: "What do you think is causing the stomach pain?"     I'm not sure partner tested positive for Hep A  9. RELIEVING/AGGRAVATING FACTORS: "What makes it better or worse?" (e.g., antacids, bending or twisting motion, bowel movement)     Eating makes it hurt more  10. OTHER SYMPTOMS: "Do you have any other symptoms?" (e.g., back pain, diarrhea, fever, urination pain, vomiting)       Diarrhea 3 episodes , partner tested positive for Hep A , Nausea  Protocols used: Abdominal Pain - Male-A-AH

## 2023-07-21 NOTE — Telephone Encounter (Signed)
Requested medication (s) are due for refill today:   Provider to review  Requested medication (s) are on the active medication list:   Yes  Future visit scheduled:   Yes 12/2   Last ordered: 03/10/2023 #90, 1 refill  Non delegated refill    Requested Prescriptions  Pending Prescriptions Disp Refills   tiZANidine (ZANAFLEX) 4 MG tablet [Pharmacy Med Name: TIZANIDINE HCL 4 MG TABLET] 90 tablet 1    Sig: TAKE 1 TABLET BY MOUTH EVERY 6 HOURS AS NEEDED FOR MUSCLE SPASMS.     Not Delegated - Cardiovascular:  Alpha-2 Agonists - tizanidine Failed - 07/19/2023  4:40 PM      Failed - This refill cannot be delegated      Passed - Valid encounter within last 6 months    Recent Outpatient Visits           3 weeks ago Rash   [place] [facility] [place], Megan P, DO   1 month ago Controlled type 2 diabetes mellitus with complication, without long-term current use of insulin (HCC)   [place] [facility] [place], Megan P, DO   2 months ago Primary hypertension   [place] [place], Megan P, DO   4 months ago Coronary artery disease involving native coronary artery of native heart without angina pectoris   [place] [facility] [place], Megan P, DO   10 months ago Routine general medical examination at a health care facility   [facility] Dorcas Carrow, DO       Future Appointments             In 2 weeks Elie Goody, MD [facility]   In 2 weeks Dorcas Carrow, DO [facility]

## 2023-07-23 ENCOUNTER — Ambulatory Visit: Payer: Medicaid Other | Admitting: Family Medicine

## 2023-08-05 ENCOUNTER — Ambulatory Visit: Payer: Medicaid Other | Admitting: Dermatology

## 2023-08-10 ENCOUNTER — Ambulatory Visit: Payer: Medicaid Other | Admitting: Family Medicine

## 2023-08-14 ENCOUNTER — Other Ambulatory Visit: Payer: Self-pay | Admitting: Family Medicine

## 2023-08-17 NOTE — Telephone Encounter (Signed)
Requested Prescriptions  Refused Prescriptions Disp Refills   losartan-hydrochlorothiazide (HYZAAR) 50-12.5 MG tablet [Pharmacy Med Name: LOSARTAN-HCTZ 50-12.5 MG TAB] 90 tablet     Sig: TAKE 1 TABLET BY MOUTH EVERY DAY     Cardiovascular: ARB + Diuretic Combos Failed - 08/14/2023  9:55 AM      Failed - K in normal range and within 180 days    Potassium  Date Value Ref Range Status  06/25/2023 3.3 (L) 3.5 - 5.2 mmol/L Final         Passed - Na in normal range and within 180 days    Sodium  Date Value Ref Range Status  06/25/2023 136 134 - 144 mmol/L Final         Passed - Cr in normal range and within 180 days    Creatinine, Ser  Date Value Ref Range Status  06/25/2023 1.06 0.76 - 1.27 mg/dL Final         Passed - eGFR is 10 or above and within 180 days    GFR calc Af Amer  Date Value Ref Range Status  01/05/2020 85 >59 mL/min/1.73 Final    Comment:    **[facility] currently reports eGFR in compliance with the current**   recommendations of the [employer]. [facility] will   update reporting as new guidelines are published from the NKF-ASN   Task force.    GFR calc non Af Amer  Date Value Ref Range Status  01/05/2020 73 >59 mL/min/1.73 Final   eGFR  Date Value Ref Range Status  06/25/2023 80 >59 mL/min/1.73 Final         Passed - Patient is not pregnant      Passed - Last BP in normal range    BP Readings from Last 1 Encounters:  06/25/23 138/79         Passed - Valid encounter within last 6 months    Recent Outpatient Visits           1 month ago Rash   [facility] [place], Megan P, DO   2 months ago Controlled type 2 diabetes mellitus with complication, without long-term current use of insulin (HCC)   [place] [facility] [place], Megan P, DO   3 months ago Primary hypertension   [place] [facility] [place], Megan P, DO   5 months ago Coronary artery disease involving native coronary  artery of native heart without angina pectoris   [facility] [place], Megan P, DO   11 months ago Routine general medical examination at a health care facility   [facility], [place], DO

## 2023-09-08 ENCOUNTER — Other Ambulatory Visit: Payer: Self-pay | Admitting: Family Medicine

## 2023-09-11 NOTE — Telephone Encounter (Signed)
 Requested medications are due for refill today.  unsure  Requested medications are on the active medications list.  yes  Last refill. 07/21/2023 #90 1 rf  Future visit scheduled.   no  Notes to clinic.  Refill not delegated.    Requested Prescriptions  Pending Prescriptions Disp Refills   tiZANidine  (ZANAFLEX ) 4 MG tablet [Pharmacy Med Name: TIZANIDINE  HCL 4 MG TABLET] 90 tablet 1    Sig: TAKE 1 TABLET BY MOUTH EVERY 6 HOURS AS NEEDED FOR MUSCLE SPASMS.     Not Delegated - Cardiovascular:  Alpha-2 Agonists - tizanidine  Failed - 09/11/2023  3:40 PM      Failed - This refill cannot be delegated      Passed - Valid encounter within last 6 months    Recent Outpatient Visits           2 months ago Rash   [facility] [place], Megan P, DO   3 months ago Controlled type 2 diabetes mellitus with complication, without long-term current use of insulin (HCC)   [place] [facility] [place], Megan P, DO   4 months ago Primary hypertension    [facility] [place], Megan P, DO   6 months ago Coronary artery disease involving native coronary artery of native heart without angina pectoris    [facility] [place], Megan P, DO   1 year ago Routine general medical examination at a health care facility   [facility], Megan P, DO

## 2023-10-26 ENCOUNTER — Other Ambulatory Visit: Payer: Self-pay | Admitting: Family Medicine

## 2023-10-27 NOTE — Telephone Encounter (Signed)
 Requested Prescriptions  Pending Prescriptions Disp Refills   tiZANidine (ZANAFLEX) 4 MG tablet [Pharmacy Med Name: TIZANIDINE HCL 4 MG TABLET] 90 tablet     Sig: TAKE 1 TABLET BY MOUTH EVERY 6 HOURS AS NEEDED FOR MUSCLE SPASMS.     Not Delegated - Cardiovascular:  Alpha-2 Agonists - tizanidine Failed - 10/27/2023  1:22 PM      Failed - This refill cannot be delegated      Passed - Valid encounter within last 6 months    Recent Outpatient Visits           4 months ago Rash   [place] [facility] [place], Megan P, DO   4 months ago Controlled type 2 diabetes mellitus with complication, without long-term current use of insulin (HCC)   [place] [facility] [place], Megan P, DO   6 months ago Primary hypertension   [place] [facility] [place], Megan P, DO   7 months ago Coronary artery disease involving native coronary artery of native heart without angina pectoris   [place] [facility] [place], [place] P, DO   1 year ago Routine general medical examination at a health care facility   [facility], Megan P, DO               ADVAIR DISKUS 250-50 MCG/ACT AEPB [Pharmacy Med Name: ADVAIR 250-50 DISKUS] 180 each 1    Sig: TAKE 1 PUFF BY MOUTH TWICE A DAY     Pulmonology:  Combination Products Passed - 10/27/2023  1:22 PM      Passed - Valid encounter within last 12 months    Recent Outpatient Visits           4 months ago Rash   [place] [facility] [employer], Megan P, DO   4 months ago Controlled type 2 diabetes mellitus with complication, without long-term current use of insulin (HCC)   [place] [facility] [place], Megan P, DO   6 months ago Primary hypertension   [place] [facility] [place], Megan P, DO   7 months ago Coronary artery disease involving native coronary artery of native heart without angina pectoris   [facility] [facility] [place], Megan P, DO   1 year ago Routine general medical examination at a health care facility   [facility], Megan P, DO

## 2023-10-27 NOTE — Telephone Encounter (Signed)
 Requested medication (s) are due for refill today: yes  Requested medication (s) are on the active medication list: yes  Last refill:  07/21/23 #90 1 RF  Future visit scheduled: yes  Notes to clinic:  med not delegated to NT to RF   Requested Prescriptions  Pending Prescriptions Disp Refills   tiZANidine (ZANAFLEX) 4 MG tablet [Pharmacy Med Name: TIZANIDINE HCL 4 MG TABLET] 90 tablet     Sig: TAKE 1 TABLET BY MOUTH EVERY 6 HOURS AS NEEDED FOR MUSCLE SPASMS.     Not Delegated - Cardiovascular:  Alpha-2 Agonists - tizanidine Failed - 10/27/2023  1:23 PM      Failed - This refill cannot be delegated      Passed - Valid encounter within last 6 months    Recent Outpatient Visits           4 months ago Rash   [place] [facility] [place], Megan P, DO   4 months ago Controlled type 2 diabetes mellitus with complication, without long-term current use of insulin (HCC)   [place] [facility] [place], Megan P, DO   6 months ago Primary hypertension   [place] [facility] [place], Megan P, DO   7 months ago Coronary artery disease involving native coronary artery of native heart without angina pectoris   [place] [facility] [place], [place] P, DO   1 year ago Routine general medical examination at a health care facility   [facility], [place] P, DO              Signed Prescriptions Disp Refills   ADVAIR DISKUS 250-50 MCG/ACT AEPB 180 each 1    Sig: TAKE 1 PUFF BY MOUTH TWICE A DAY     Pulmonology:  Combination Products Passed - 10/27/2023  1:23 PM      Passed - Valid encounter within last 12 months    Recent Outpatient Visits           4 months ago Rash   [place] [facility] [place], Megan P, DO   4 months ago Controlled type 2 diabetes mellitus with complication, without long-term current use of insulin (HCC)   [place] [facility] [place], Megan P,  DO   6 months ago Primary hypertension   [place] [facility] [place], Megan P, DO   7 months ago Coronary artery disease involving native coronary artery of native heart without angina pectoris   [place] [facility] [place], Megan P, DO   1 year ago Routine general medical examination at a health care facility   [facility], Megan P, DO

## 2023-11-18 ENCOUNTER — Other Ambulatory Visit: Payer: Self-pay | Admitting: Family Medicine

## 2023-12-05 ENCOUNTER — Other Ambulatory Visit: Payer: Self-pay | Admitting: Family Medicine

## 2023-12-07 ENCOUNTER — Other Ambulatory Visit: Payer: Self-pay | Admitting: Family Medicine

## 2023-12-07 NOTE — Telephone Encounter (Signed)
 Requested medications are due for refill today.  yes  Requested medications are on the active medications list.  yes  Last refill. 06/11/2023 #90 1 rf  Future visit scheduled.   no  Notes to clinic.  Abnormal labs. Pt was to return for follow up labs.    Requested Prescriptions  Pending Prescriptions Disp Refills   losartan-hydrochlorothiazide (HYZAAR) 100-25 MG tablet [Pharmacy Med Name: LOSARTAN-HCTZ 100-25 MG TAB] 90 tablet 1    Sig: TAKE 1 TABLET BY MOUTH EVERY DAY     Cardiovascular: ARB + Diuretic Combos Failed - 12/07/2023  4:16 PM      Failed - K in normal range and within 180 days    Potassium  Date Value Ref Range Status  06/25/2023 3.3 (L) 3.5 - 5.2 mmol/L Final         Failed - Valid encounter within last 6 months    Recent Outpatient Visits   None            Passed - Na in normal range and within 180 days    Sodium  Date Value Ref Range Status  06/25/2023 136 134 - 144 mmol/L Final         Passed - Cr in normal range and within 180 days    Creatinine, Ser  Date Value Ref Range Status  06/25/2023 1.06 0.76 - 1.27 mg/dL Final         Passed - eGFR is 10 or above and within 180 days    GFR calc Af Amer  Date Value Ref Range Status  01/05/2020 85 >59 mL/min/1.73 Final    Comment:    **[facility] currently reports eGFR in compliance with the current**   recommendations of the [employer]. [facility] will   update reporting as new guidelines are published from the NKF-ASN   Task force.    GFR calc non Af Amer  Date Value Ref Range Status  01/05/2020 73 >59 mL/min/1.73 Final   eGFR  Date Value Ref Range Status  06/25/2023 80 >59 mL/min/1.73 Final         Passed - Patient is not pregnant      Passed - Last BP in normal range    BP Readings from Last 1 Encounters:  06/25/23 138/79

## 2023-12-07 NOTE — Telephone Encounter (Signed)
 Needs follow-up

## 2023-12-08 NOTE — Telephone Encounter (Signed)
 Appointment scheduled.

## 2023-12-09 NOTE — Telephone Encounter (Signed)
 Requested medication (s) are due for refill today: yes  Requested medication (s) are on the active medication list: yes  Last refill:  10/28/23  Future visit scheduled: yes  Notes to clinic:  Unable to refill per protocol, cannot delegate.      Requested Prescriptions  Pending Prescriptions Disp Refills   tiZANidine (ZANAFLEX) 4 MG tablet [Pharmacy Med Name: TIZANIDINE HCL 4 MG TABLET] 90 tablet 0    Sig: TAKE 1 TABLET BY MOUTH EVERY 6 HOURS AS NEEDED FOR MUSCLE SPASMS.     Not Delegated - Cardiovascular:  Alpha-2 Agonists - tizanidine Failed - 12/09/2023 12:25 PM      Failed - This refill cannot be delegated      Failed - Valid encounter within last 6 months    Recent Outpatient Visits   None

## 2023-12-22 ENCOUNTER — Other Ambulatory Visit: Payer: Self-pay | Admitting: Family Medicine

## 2023-12-24 NOTE — Telephone Encounter (Signed)
 Requested medications are due for refill today.  yes  Requested medications are on the active medications list.  yes  Last refill. 12/06/2023 #30 0 rf  Future visit scheduled.   yes  Notes to clinic.  Labs revealed low potassium. Pt did not return for follow up labs.Please review.    Requested Prescriptions  Pending Prescriptions Disp Refills   losartan-hydrochlorothiazide (HYZAAR) 100-25 MG tablet [Pharmacy Med Name: LOSARTAN-HCTZ 100-25 MG TAB] 90 tablet 1    Sig: TAKE 1 TABLET BY MOUTH EVERY DAY     Cardiovascular: ARB + Diuretic Combos Failed - 12/24/2023  7:59 AM      Failed - K in normal range and within 180 days    Potassium  Date Value Ref Range Status  06/25/2023 3.3 (L) 3.5 - 5.2 mmol/L Final         Failed - Na in normal range and within 180 days    Sodium  Date Value Ref Range Status  06/25/2023 136 134 - 144 mmol/L Final         Failed - Cr in normal range and within 180 days    Creatinine, Ser  Date Value Ref Range Status  06/25/2023 1.06 0.76 - 1.27 mg/dL Final         Failed - eGFR is 10 or above and within 180 days    GFR calc Af Amer  Date Value Ref Range Status  01/05/2020 85 >59 mL/min/1.73 Final    Comment:    **[facility] currently reports eGFR in compliance with the current**   recommendations of the [employer]. [facility] will   update reporting as new guidelines are published from the NKF-ASN   Task force.    GFR calc non Af Amer  Date Value Ref Range Status  01/05/2020 73 >59 mL/min/1.73 Final   eGFR  Date Value Ref Range Status  06/25/2023 80 >59 mL/min/1.73 Final         Failed - Valid encounter within last 6 months    Recent Outpatient Visits   None            Passed - Patient is not pregnant      Passed - Last BP in normal range    BP Readings from Last 1 Encounters:  06/25/23 138/79

## 2024-01-26 ENCOUNTER — Encounter: Payer: Self-pay | Admitting: Family Medicine

## 2024-01-26 ENCOUNTER — Ambulatory Visit (INDEPENDENT_AMBULATORY_CARE_PROVIDER_SITE_OTHER): Admitting: Family Medicine

## 2024-01-26 VITALS — BP 166/83 | HR 93 | Ht 69.0 in | Wt 248.8 lb

## 2024-01-26 DIAGNOSIS — E118 Type 2 diabetes mellitus with unspecified complications: Secondary | ICD-10-CM

## 2024-01-26 DIAGNOSIS — R3911 Hesitancy of micturition: Secondary | ICD-10-CM

## 2024-01-26 DIAGNOSIS — J449 Chronic obstructive pulmonary disease, unspecified: Secondary | ICD-10-CM | POA: Diagnosis not present

## 2024-01-26 DIAGNOSIS — F4321 Adjustment disorder with depressed mood: Secondary | ICD-10-CM | POA: Diagnosis not present

## 2024-01-26 DIAGNOSIS — Z8249 Family history of ischemic heart disease and other diseases of the circulatory system: Secondary | ICD-10-CM

## 2024-01-26 DIAGNOSIS — E782 Mixed hyperlipidemia: Secondary | ICD-10-CM | POA: Diagnosis not present

## 2024-01-26 DIAGNOSIS — Z720 Tobacco use: Secondary | ICD-10-CM | POA: Diagnosis not present

## 2024-01-26 DIAGNOSIS — R079 Chest pain, unspecified: Secondary | ICD-10-CM | POA: Diagnosis not present

## 2024-01-26 DIAGNOSIS — F3341 Major depressive disorder, recurrent, in partial remission: Secondary | ICD-10-CM

## 2024-01-26 DIAGNOSIS — I10 Essential (primary) hypertension: Secondary | ICD-10-CM

## 2024-01-26 LAB — MICROALBUMIN, URINE WAIVED
Creatinine, Urine Waived: 100 mg/dL (ref 10–300)
Microalb, Ur Waived: 30 mg/L — ABNORMAL HIGH (ref 0–19)
Microalb/Creat Ratio: <30 mg/g (ref <30)

## 2024-01-26 LAB — BAYER DCA HB A1C WAIVED: HB A1C (BAYER DCA - WAIVED): 6.5 % — ABNORMAL HIGH (ref 4.8–5.6)

## 2024-01-26 MED ORDER — LOSARTAN POTASSIUM-HCTZ 100-25 MG PO TABS: 1.0000 | ORAL_TABLET | Freq: Every day | ORAL | 1 refills | Status: DC

## 2024-01-26 MED ORDER — AMLODIPINE BESYLATE 5 MG PO TABS: 5.0000 mg | ORAL_TABLET | Freq: Every day | ORAL | 2 refills | Status: DC

## 2024-01-26 MED ORDER — ATORVASTATIN CALCIUM 40 MG PO TABS: 40.0000 mg | ORAL_TABLET | ORAL | 0 refills | Status: DC

## 2024-01-26 MED ORDER — PEN NEEDLES 32G X 4 MM MISC: 12 refills | Status: AC

## 2024-01-26 MED ORDER — TIZANIDINE HCL 4 MG PO TABS: 4.0000 mg | ORAL_TABLET | Freq: Every day | ORAL | 1 refills | Status: AC

## 2024-01-26 MED ORDER — OZEMPIC (0.25 OR 0.5 MG/DOSE) 2 MG/3ML ~~LOC~~ SOPN: 0.5000 mg | PEN_INJECTOR | SUBCUTANEOUS | 0 refills | Status: DC

## 2024-01-26 NOTE — Assessment & Plan Note (Signed)
 Missed his previous low-dose CT. Ordered again. Await results.

## 2024-01-26 NOTE — Assessment & Plan Note (Signed)
 Under good control on current regimen. Continue current regimen. Continue to monitor. Call with any concerns. Refills given. Labs drawn today.

## 2024-01-26 NOTE — Assessment & Plan Note (Signed)
 Unable to tolerate higher doses previously. Continue weekly atorvastatin . Rechecking labs today. Await results. Treat as needed.

## 2024-01-26 NOTE — Assessment & Plan Note (Addendum)
 In exacerbation with the loss of his partner. Doesn't want to start medicine at this time- will get him hooked up with grief counselor. Call with any concerns. Recheck in about a month.

## 2024-01-26 NOTE — Progress Notes (Signed)
 BP (!) 166/83 (BP Location: Left Arm, Patient Position: Sitting)   Pulse 93   Ht 5\' 9"  (1.753 m)   Wt 248 lb 12.8 oz (112.9 kg)   SpO2 98%   BMI 36.74 kg/m    Subjective:    Patient ID: Jimmy Simmons, male    DOB: 12-08-1960, 63 y.o.   MRN: [id number]  HPI: Jimmy Simmons is a 63 y.o. male  Chief Complaint  Patient presents with   Hypertension   Diabetes   DIABETES Hypoglycemic episodes:no Polydipsia/polyuria: no Visual disturbance: no Chest pain: no Paresthesias: no Glucose Monitoring: no  Accucheck frequency: Not Checking Taking Insulin?: no Blood Pressure Monitoring: not checking Retinal Examination: Not up to Date Foot Exam: Up to Date Diabetic Education: Not Completed Pneumovax: Not up to Date Influenza: Not up to Date Aspirin: no  HYPERTENSION / HYPERLIPIDEMIA- didn't take his medicine for a couple of days, but then took 2 today Satisfied with current treatment? no Duration of hypertension: chronic BP monitoring frequency: not checking BP medication side effects: no Past BP meds: losartan , HCTZ Duration of hyperlipidemia: chronic Cholesterol medication side effects: yes Cholesterol supplements: none Past cholesterol medications: atorvastatin  Medication compliance: excellent compliance Aspirin: no Recent stressors: yes Recurrent headaches: no Visual changes: no Palpitations: no Dyspnea: no Chest pain: yes Lower extremity edema: no Dizzy/lightheaded: no  DEPRESSION Mood status: exacerbated Satisfied with current treatment?: no Symptom severity: moderate  Duration of current treatment : not on anything Side effects: N/A Psychotherapy/counseling: no  Depressed mood: yes Anxious mood: yes Anhedonia: no Significant weight loss or gain: no Insomnia: no  Fatigue: yes Feelings of worthlessness or guilt: no Impaired concentration/indecisiveness: no Suicidal ideations: no Hopelessness: no Crying spells: no    01/26/2024    3:26 PM 06/25/2023   10:50  AM 04/23/2023   10:33 AM 03/09/2023   10:00 AM 09/05/2022    9:17 AM  Depression screen PHQ 2/9  Decreased Interest 0 0 0 0 0  Down, Depressed, Hopeless 0 0 0 0 0  PHQ - 2 Score 0 0 0 0 0  Altered sleeping 0 0 0 0 0  Tired, decreased energy 0 0 0 0 0  Change in appetite 0 0 0 0 0  Feeling bad or failure about yourself  0 0 0 0 0  Trouble concentrating 0 0 0 0 0  Moving slowly or fidgety/restless 0 0 0 0 0  Suicidal thoughts 0 0 0 0 0  PHQ-9 Score 0 0 0 0 0  Difficult doing work/chores Not difficult at all Not difficult at all Not difficult at all Not difficult at all Not difficult at all     Relevant past medical, surgical, family and social history reviewed and updated as indicated. Interim medical history since our last visit reviewed. Allergies and medications reviewed and updated.  Review of Systems  Constitutional: Negative.   Respiratory:  Positive for chest tightness. Negative for apnea, cough, choking, shortness of breath, wheezing and stridor.   Cardiovascular:  Positive for chest pain. Negative for palpitations and leg swelling.  Gastrointestinal: Negative.   Musculoskeletal: Negative.   Neurological: Negative.   Psychiatric/Behavioral:  Positive for dysphoric mood. Negative for agitation, behavioral problems, confusion, decreased concentration, hallucinations, self-injury, sleep disturbance and suicidal ideas. The patient is nervous/anxious. The patient is not hyperactive.     Per HPI unless specifically indicated above     Objective:     BP (!) 166/83 (BP Location: Left Arm, Patient Position: Sitting)   Pulse 93  Ht 5\' 9"  (1.753 m)   Wt 248 lb 12.8 oz (112.9 kg)   SpO2 98%   BMI 36.74 kg/m   Wt Readings from Last 3 Encounters:  01/26/24 248 lb 12.8 oz (112.9 kg)  06/25/23 255 lb 9.6 oz (115.9 kg)  06/11/23 253 lb (114.8 kg)    Physical Exam Vitals and nursing note reviewed.  Constitutional:      General: He is not in acute distress.    Appearance:  Normal appearance. He is obese. He is not ill-appearing, toxic-appearing or diaphoretic.  HENT:     Head: Normocephalic and atraumatic.     Right Ear: External ear normal.     Left Ear: External ear normal.     Nose: Nose normal.     Mouth/Throat:     Mouth: Mucous membranes are moist.     Pharynx: Oropharynx is clear.  Eyes:     General: No scleral icterus.       Right eye: No discharge.        Left eye: No discharge.     Extraocular Movements: Extraocular movements intact.     Conjunctiva/sclera: Conjunctivae normal.     Pupils: Pupils are equal, round, and reactive to light.  Cardiovascular:     Rate and Rhythm: Normal rate and regular rhythm.     Pulses: Normal pulses.     Heart sounds: Normal heart sounds. No murmur heard.    No friction rub. No gallop.  Pulmonary:     Effort: Pulmonary effort is normal. No respiratory distress.     Breath sounds: Normal breath sounds. No stridor. No wheezing, rhonchi or rales.  Chest:     Chest wall: No tenderness.  Musculoskeletal:        General: Normal range of motion.     Cervical back: Normal range of motion and neck supple.  Skin:    General: Skin is warm and dry.     Capillary Refill: Capillary refill takes less than 2 seconds.     Coloration: Skin is not jaundiced or pale.     Findings: No bruising, erythema, lesion or rash.  Neurological:     General: No focal deficit present.     Mental Status: He is alert and oriented to person, place, and time. Mental status is at baseline.  Psychiatric:        Mood and Affect: Mood normal.        Behavior: Behavior normal.        Thought Content: Thought content normal.        Judgment: Judgment normal.     Results for orders placed or performed in visit on 01/26/24  Microalbumin, Urine Waived   Collection Time: 01/26/24  3:43 PM  Result Value Ref Range   Microalb, Ur Waived 30 (H) 0 - 19 mg/L   Creatinine, Urine Waived 100 10 - 300 mg/dL   Microalb/Creat Ratio <30 <30 mg/g   Bayer DCA Hb A1c Waived   Collection Time: 01/26/24  3:43 PM  Result Value Ref Range   HB A1C (BAYER DCA - WAIVED) 6.5 (H) 4.8 - 5.6 %      Assessment & Plan:   Problem List Items Addressed This Visit       Cardiovascular and Mediastinum   HTN (hypertension)   Not under good control. Will add in 5mg  of amlodipine and recheck in 1 month. Stressed the importance of not doubling up on his losartan . Labs drawn today.  Relevant Medications   losartan -hydrochlorothiazide  (HYZAAR) 100-25 MG tablet   atorvastatin  (LIPITOR) 40 MG tablet   amLODipine (NORVASC) 5 MG tablet   Other Relevant Orders   Comprehensive metabolic panel with GFR   CBC with Differential/Platelet   TSH   Microalbumin, Urine Waived (Completed)     Respiratory   COPD (chronic obstructive pulmonary disease) (HCC)   Under good control on current regimen. Continue current regimen. Continue to monitor. Call with any concerns. Refills given. Labs drawn today.          Endocrine   Controlled type 2 diabetes mellitus with complication, without long-term current use of insulin (HCC) - Primary   Doing well with A1c of 6.5 down from 7.4. Will increase his ozempic  to 0.5mg  for increased weight loss. Recheck in 3 months. Call with any concerns.       Relevant Medications   losartan -hydrochlorothiazide  (HYZAAR) 100-25 MG tablet   atorvastatin  (LIPITOR) 40 MG tablet   Semaglutide ,0.25 or 0.5MG /DOS, (OZEMPIC , 0.25 OR 0.5 MG/DOSE,) 2 MG/3ML SOPN   Other Relevant Orders   Comprehensive metabolic panel with GFR   CBC with Differential/Platelet   Microalbumin, Urine Waived (Completed)   Bayer DCA Hb A1c Waived (Completed)     Other   Depression   In exacerbation with the loss of his partner. Doesn't want to start medicine at this time- will get him hooked up with grief counselor. Call with any concerns. Recheck in about a month.       Tobacco abuse   Missed his previous low-dose CT. Ordered again. Await results.        Relevant Orders   CT CHEST LUNG CA SCREEN LOW DOSE W/O CM   Hyperlipidemia   Unable to tolerate higher doses previously. Continue weekly atorvastatin . Rechecking labs today. Await results. Treat as needed.      Relevant Medications   losartan -hydrochlorothiazide  (HYZAAR) 100-25 MG tablet   atorvastatin  (LIPITOR) 40 MG tablet   amLODipine (NORVASC) 5 MG tablet   Other Relevant Orders   Comprehensive metabolic panel with GFR   CBC with Differential/Platelet   Lipid Panel w/o Chol/HDL Ratio   Other Visit Diagnoses       Hesitancy       Labs drawn today. Await results.   Relevant Orders   PSA     Family history of heart disease in brother       Of unclear etiology. Was recommended to see cardiology. Referral generated today.   Relevant Orders   Ambulatory referral to Cardiology     Grief       Referral to grief counselor placed today. Call with any concerns.   Relevant Orders   AMB Referral [facility] Care Management     Chest pain, unspecified type       EKG normal. To see cardiology due to family history of heart disease. Labs drawn today.   Relevant Orders   EKG 12-Lead        Follow up plan: Return in about 4 weeks (around 02/23/2024) for physical.

## 2024-01-26 NOTE — Assessment & Plan Note (Signed)
 Doing well with A1c of 6.5 down from 7.4. Will increase his ozempic  to 0.5mg  for increased weight loss. Recheck in 3 months. Call with any concerns.

## 2024-01-26 NOTE — Assessment & Plan Note (Signed)
 Not under good control. Will add in 5mg  of amlodipine and recheck in 1 month. Stressed the importance of not doubling up on his losartan . Labs drawn today.

## 2024-01-27 ENCOUNTER — Other Ambulatory Visit: Payer: Self-pay

## 2024-01-27 DIAGNOSIS — Z122 Encounter for screening for malignant neoplasm of respiratory organs: Secondary | ICD-10-CM

## 2024-01-27 DIAGNOSIS — Z87891 Personal history of nicotine dependence: Secondary | ICD-10-CM

## 2024-01-27 DIAGNOSIS — F17210 Nicotine dependence, cigarettes, uncomplicated: Secondary | ICD-10-CM

## 2024-01-27 LAB — CBC WITH DIFFERENTIAL/PLATELET
Basophils Absolute: 0.1 10*3/uL (ref 0.0–0.2)
Basos: 1 %
EOS (ABSOLUTE): 0.1 10*3/uL (ref 0.0–0.4)
Eos: 1 %
Hematocrit: 48.3 % (ref 37.5–51.0)
Hemoglobin: 16.6 g/dL (ref 13.0–17.7)
Immature Grans (Abs): 0 10*3/uL (ref 0.0–0.1)
Immature Granulocytes: 0 %
Lymphocytes Absolute: 2.5 10*3/uL (ref 0.7–3.1)
Lymphs: 27 %
MCH: 33.2 pg — ABNORMAL HIGH (ref 26.6–33.0)
MCHC: 34.4 g/dL (ref 31.5–35.7)
MCV: 97 fL (ref 79–97)
Monocytes Absolute: 0.7 10*3/uL (ref 0.1–0.9)
Monocytes: 8 %
Neutrophils Absolute: 6.1 10*3/uL (ref 1.4–7.0)
Neutrophils: 63 %
Platelets: 192 10*3/uL (ref 150–450)
RBC: 5 x10E6/uL (ref 4.14–5.80)
RDW: 13.4 % (ref 11.6–15.4)
WBC: 9.5 10*3/uL (ref 3.4–10.8)

## 2024-01-27 LAB — COMPREHENSIVE METABOLIC PANEL WITH GFR
ALT: 24 IU/L (ref 0–44)
AST: 22 IU/L (ref 0–40)
Albumin: 5 g/dL — ABNORMAL HIGH (ref 3.9–4.9)
Alkaline Phosphatase: 92 IU/L (ref 44–121)
BUN/Creatinine Ratio: 11 (ref 10–24)
BUN: 10 mg/dL (ref 8–27)
Bilirubin Total: 0.5 mg/dL (ref 0.0–1.2)
CO2: 21 mmol/L (ref 20–29)
Calcium: 10.2 mg/dL (ref 8.6–10.2)
Chloride: 98 mmol/L (ref 96–106)
Creatinine, Ser: 0.93 mg/dL (ref 0.76–1.27)
Globulin, Total: 2.6 g/dL (ref 1.5–4.5)
Glucose: 113 mg/dL — ABNORMAL HIGH (ref 70–99)
Potassium: 3.6 mmol/L (ref 3.5–5.2)
Sodium: 138 mmol/L (ref 134–144)
Total Protein: 7.6 g/dL (ref 6.0–8.5)
eGFR: 93 mL/min/{1.73_m2} (ref >59)

## 2024-01-27 LAB — LIPID PANEL W/O CHOL/HDL RATIO
Cholesterol, Total: 159 mg/dL (ref 100–199)
HDL: 50 mg/dL (ref >39)
LDL Chol Calc (NIH): 64 mg/dL (ref 0–99)
Triglycerides: 284 mg/dL — ABNORMAL HIGH (ref 0–149)
VLDL Cholesterol Cal: 45 mg/dL — ABNORMAL HIGH (ref 5–40)

## 2024-01-27 LAB — PSA: Prostate Specific Ag, Serum: 1.4 ng/mL (ref 0.0–4.0)

## 2024-01-27 LAB — TSH: TSH: 2.12 u[IU]/mL (ref 0.450–4.500)

## 2024-01-29 ENCOUNTER — Ambulatory Visit: Payer: Self-pay | Admitting: Family Medicine

## 2024-02-03 ENCOUNTER — Other Ambulatory Visit: Payer: Self-pay | Admitting: Licensed Clinical Social Worker

## 2024-02-03 NOTE — Patient Outreach (Signed)
 Complex Care Management   Visit Note  02/03/2024  Name:  Jimmy Simmons MRN: [id number] DOB: November 14, 1960  Situation: Referral received for Complex Care Management related to grief I obtained verbal consent from Patient.  Visit completed with patient  on the phone  Background:   Past Medical History:  Diagnosis Date   Alcohol abuse    Anxiety    COPD (chronic obstructive pulmonary disease) (HCC)    Hepatitis B    Hepatitis C    completed tx 6 mo ago   Psoriasis    Seizure in childhood [facility])    took meds from approx age 62 to age 41.  No Recurrance    Assessment: Patient Reported Symptoms:  Cognitive Cognitive Status: Alert and oriented to person, place, and time, Insightful and able to interpret abstract concepts, Normal speech and language skills Cognitive/Intellectual Conditions Management [RPT]: None reported or documented in medical history or problem list   Health Maintenance Behaviors: Annual physical exam, Stress management Healing Pattern: Unsure Health Facilitated by: Stress management  Neurological Neurological Review of Symptoms: No symptoms reported Neurological Management Strategies: Activity, Routine screening, Medication therapy Neurological Self-Management Outcome: 4 (good)  HEENT HEENT Symptoms Reported: No symptoms reported      Cardiovascular Cardiovascular Symptoms Reported: No symptoms reported Does patient have uncontrolled Hypertension?: Yes Is patient checking Blood Pressure at home?: No Patient's Recent BP reading at home: N/A Cardiovascular Conditions: Hypertension, Coronary artery disease Cardiovascular Management Strategies: Activity, Diet modification, Medication therapy Cardiovascular Self-Management Outcome: 4 (good) Cardiovascular Comment: Pt denied any cardiac symptoms at this time, reports this is well managed by provider.  Respiratory Respiratory Symptoms Reported: No symptoms reported Other Respiratory Symptoms: Reports using inhaler to  manage respiratory symptoms - well controlled at this time. Respiratory Conditions: COPD Respiratory Self-Management Outcome: 4 (good)  Endocrine Patient reports the following symptoms related to hypoglycemia or hyperglycemia : No symptoms reported Is patient diabetic?: Yes Is patient checking blood sugars at home?: No Endocrine Conditions: Diabetes Endocrine Management Strategies: Activity, Medication therapy Endocrine Self-Management Outcome: 3 (uncertain)  Gastrointestinal Gastrointestinal Symptoms Reported: No symptoms reported      Genitourinary Genitourinary Symptoms Reported: No symptoms reported    Integumentary Integumentary Symptoms Reported: No symptoms reported    Musculoskeletal Musculoskelatal Symptoms Reviewed: No symptoms reported Additional Musculoskeletal Details: Osteoarthritis of the spine - reports it's well controlled at this time Musculoskeletal Conditions: Osteoarthritis Musculoskeletal Management Strategies: Activity Musculoskeletal Self-Management Outcome: 4 (good) Falls in the past year?: No Number of falls in past year: 1 or less Was there an injury with Fall?: No Fall Risk Category Calculator: 0 Patient Fall Risk Level: Low Fall Risk Patient at Risk for Falls Due to: No Fall Risks  Psychosocial Psychosocial Symptoms Reported: Anxiety - if selected complete GAD Additional Psychological Details: Fiance passed away in 11-14-2023 of this year - son has moved in with a grandparent. Discussed grief counseling and doing this with son - pt agreed to consider it and took their referral information. Discussed coping skills for stress of possibility of medication management - pt denied medications at this time. Behavioral Health Conditions: Other Other Behavorial Health Conditions: Grief Behavioral Management Strategies: Activity, Coping strategies, Counseling, Support system Behavioral Health Self-Management Outcome: 3 (uncertain) Major Change/Loss/Stressor/Fears (CP):  Death of a loved one Behaviors When Feeling Stressed/Fearful: Has turned to alcohol in the past, denies current use. Techniques to Cope with Loss/Stress/Change: Diversional activities Quality of Family Relationships: helpful, involved Do you feel physically threatened by others?: No  02/03/2024    3:47 PM  Depression screen PHQ 2/9  Decreased Interest 0  Down, Depressed, Hopeless 0  PHQ - 2 Score 0  Altered sleeping 0  Tired, decreased energy 0  Change in appetite 0  Feeling bad or failure about yourself  0  Trouble concentrating 0  Moving slowly or fidgety/restless 0  Suicidal thoughts 0  PHQ-9 Score 0  Difficult doing work/chores Not difficult at all    There were no vitals filed for this visit.  Medications Reviewed Today     Reviewed by Jens Molder, LCSW (Social Worker) on 02/03/24 at 1446  Med List Status: <None>   Medication Order Taking? Sig Documenting Provider Last Dose Status Informant  ADVAIR DISKUS 250-50 MCG/ACT AEPB [id number] Yes TAKE 1 PUFF BY MOUTH TWICE A DAY Johnson, Megan P, DO Taking Active   albuterol  (VENTOLIN  HFA) 108 (90 Base) MCG/ACT inhaler [id number] Yes Inhale 2 puffs into the lungs every 6 (six) hours as needed for wheezing or shortness of breath. Lincoln Renshaw, Megan P, DO Taking Active   amLODipine (NORVASC) 5 MG tablet [id number] Yes Take 1 tablet (5 mg total) by mouth daily. Johnson, Megan P, DO Taking Active   atorvastatin  (LIPITOR) 40 MG tablet [id number] Yes Take 1 tablet (40 mg total) by mouth once a week. Johnson, Megan P, DO Taking Active   Insulin Pen Needle (PEN NEEDLES) 32G X 4 MM MISC [id number]  Use with ozempic  weekly Terre Ferri P, DO  Active   losartan -hydrochlorothiazide  (HYZAAR) 100-25 MG tablet [id number] Yes Take 1 tablet by mouth daily. Johnson, Megan P, DO Taking Active   Semaglutide ,0.25 or 0.5MG /DOS, (OZEMPIC , 0.25 OR 0.5 MG/DOSE,) 2 MG/3ML SOPN [id number] Yes Inject 0.5 mg into the skin once a week. Terre Ferri P, DO Taking  Active   tiZANidine  (ZANAFLEX ) 4 MG tablet [id number] Yes Take 1 tablet (4 mg total) by mouth at bedtime. Solomon Dupre, DO Taking Active             Recommendation:   Consider hospice grief counseling with son, medication management for stress. Monitor for changes in mood and utilize coping skills as needed.  Follow Up Plan:   Telephone follow up appointment date/time:  03/16/2024 - 6 week follow up per pt request.  Hale Level, LCSW [place], [facility] Licensed Clinical Social Worker Care Coordinator [phone]

## 2024-02-03 NOTE — Patient Instructions (Signed)
 Visit Information  Thank you for taking time to visit with me today. Please don't hesitate to contact me if I can be of assistance to you before our next scheduled appointment.  Our next appointment is by telephone on 03/16/2024. Please call the care guide team at [phone] if you need to cancel or reschedule your appointment.   Following is a copy of your care plan:   Goals Addressed             This Visit's Progress    [facility]BC[facility] Social Work Care Plan       Problems:   Disease Management support and education needs related to Grief  CSW Clinical Goal(s):   Over the next 6 weeks the Patient will demonstrate a reduction in symptoms related to Grief .  Interventions:  Mental Health:  Evaluation of current treatment plan related to Grief Active listening / Reflection utilized Behavioral Activation reviewed Caregiver stress acknowledged :Caring for son who's mother passed away Depression screen reviewed Emotional Support Provided Participation in counseling encourage : encouraged grief counseling with son through local hospice PHQ2/PHQ9 completed Problem Solving /Task Center strategies reviewed Provided general psycho-education for mental health needs Reviewed mental health medications and discussed importance of compliance: pt agreed to consider medication management  Patient Goals/Self-Care Activities:  Connect with provider for ongoing mental health treatment.   Continue taking your medication as prescribed.   Consider grief counseling with son  Plan:   Telephone follow up appointment with care management team member scheduled for:  03/16/2024 - 6 week follow up per pt request        Please call the Suicide and Crisis Lifeline: 988 if you are experiencing a Mental Health or Behavioral Health Crisis or need someone to talk to.  Patient verbalizes understanding of instructions and care plan provided today and agrees to view in MyChart. Active MyChart status and patient  understanding of how to access instructions and care plan via MyChart confirmed with patient.     Hale Level, LCSW [place], [facility] Licensed Clinical Social Worker Care Coordinator [phone]

## 2024-02-24 ENCOUNTER — Ambulatory Visit: Attending: Acute Care

## 2024-03-08 ENCOUNTER — Encounter: Admitting: Family Medicine

## 2024-03-10 ENCOUNTER — Telehealth: Payer: Self-pay | Admitting: Family Medicine

## 2024-03-10 NOTE — Telephone Encounter (Signed)
 FYI: Patient came into office for an appointment. His insurance was inactive. Patient chose to reschedule appt. Asked to get a message to provider stating that the medication that was prescribed at his last visit he is not going to take because he researched it and said it causes too many side effects. Asked patient if he could please provide me the name of the medication to better inform his provider and he stated it is for clearing the arteries and that his provider would know.

## 2024-03-10 NOTE — Telephone Encounter (Signed)
 The only medicine started last visit was amlodipine - which does not clear his arteries, it lowers his blood pressure and he does need to take that. Can we please find out what he's talking about?

## 2024-03-15 NOTE — Telephone Encounter (Signed)
 Ok for E2C2 to review.  Attempted return call to patient however VM is full and unable to leave a message. Will try again tomorrow.

## 2024-03-16 ENCOUNTER — Other Ambulatory Visit: Payer: Self-pay | Admitting: Licensed Clinical Social Worker

## 2024-03-16 NOTE — Patient Outreach (Signed)
 Complex Care Management   Visit Note  03/16/2024  Name:  Jimmy Simmons MRN: [id number] DOB: [birth year]  Situation: Referral received for Complex Care Management related to grief I obtained verbal consent from Patient.  Visit completed with patient  on the phone  Background:   Past Medical History:  Diagnosis Date   Alcohol abuse    Anxiety    COPD (chronic obstructive pulmonary disease) (HCC)    Hepatitis B    Hepatitis C    completed tx 6 mo ago   Psoriasis    Seizure in childhood [facility])    took meds from approx age 5 to age 7.  No Recurrance    Assessment: Patient Reported Symptoms:  Cognitive Cognitive Status: Alert and oriented to person, place, and time, Normal speech and language skills, Insightful and able to interpret abstract concepts      Neurological Neurological Review of Symptoms: No symptoms reported    HEENT HEENT Symptoms Reported: No symptoms reported      Cardiovascular Cardiovascular Symptoms Reported: No symptoms reported    Respiratory Respiratory Symptoms Reported: No symptoms reported    Endocrine Endocrine Symptoms Reported: Not assessed    Gastrointestinal Gastrointestinal Symptoms Reported: Not assessed      Genitourinary Genitourinary Symptoms Reported: Not assessed    Integumentary Integumentary Symptoms Reported: Not assessed    Musculoskeletal Musculoskelatal Symptoms Reviewed: Not assessed        Psychosocial Psychosocial Symptoms Reported: Other Other Psychosocial Conditions: Grief Additional Psychological Details: Reports symptoms are well managed at this time and denied any follow up by CSW Behavioral Management Strategies: Support system, Coping strategies Behavioral Health Self-Management Outcome: 4 (good) Behavioral Health Comment: Declined grief counseling Major Change/Loss/Stressor/Fears (CP): Death of a loved one        17-Feb-2024    3:47 PM  Depression screen PHQ 2/9  Decreased Interest 0  Down, Depressed, Hopeless 0   PHQ - 2 Score 0  Altered sleeping 0  Tired, decreased energy 0  Change in appetite 0  Feeling bad or failure about yourself  0  Trouble concentrating 0  Moving slowly or fidgety/restless 0  Suicidal thoughts 0  PHQ-9 Score 0  Difficult doing work/chores Not difficult at all    There were no vitals filed for this visit.  Medications Reviewed Today     Reviewed by Kit Alm LABOR, LCSW (Social Worker) on 03/16/24 at 1409  Med List Status: <None>   Medication Order Taking? Sig Documenting Provider Last Dose Status Informant  ADVAIR DISKUS 250-50 MCG/ACT AEPB [id number] Yes TAKE 1 PUFF BY MOUTH TWICE A DAY Johnson, Megan P, DO  Active   albuterol  (VENTOLIN  HFA) 108 (90 Base) MCG/ACT inhaler [id number] Yes Inhale 2 puffs into the lungs every 6 (six) hours as needed for wheezing or shortness of breath. Johnson, Megan P, DO  Active   amLODipine  (NORVASC ) 5 MG tablet [id number] Yes Take 1 tablet (5 mg total) by mouth daily. Johnson, Megan P, DO  Active   atorvastatin  (LIPITOR) 40 MG tablet [id number] Yes Take 1 tablet (40 mg total) by mouth once a week. Johnson, Megan P, DO  Active   Insulin Pen Needle (PEN NEEDLES) 32G X 4 MM MISC [id number]  Use with ozempic  weekly Vicci, Megan P, DO  Active   losartan -hydrochlorothiazide  (HYZAAR) 100-25 MG tablet [id number] Yes Take 1 tablet by mouth daily. Johnson, Megan P, DO  Active   Semaglutide ,0.25 or 0.5MG /DOS, (OZEMPIC , 0.25 OR 0.5 MG/DOSE,) 2 MG/3ML SOPN [id number] Yes Inject 0.5 mg  into the skin once a week. Johnson, Megan P, DO  Active   tiZANidine  (ZANAFLEX ) 4 MG tablet [id number] Yes Take 1 tablet (4 mg total) by mouth at bedtime. Vicci Bouchard P, DO  Active             Recommendation:   Declined grief counseling at this time, reported hi symptoms were well managed. Declined any further follow up by CSW   Follow Up Plan:   Patient has met all care management goals. Care Management case will be closed. Patient has been provided contact  information should new needs arise.   Alm Armor, LCSW [place], [facility] Licensed Clinical Social Worker Care Coordinator [phone]

## 2024-03-16 NOTE — Telephone Encounter (Signed)
 Reviewed with patient. He explains that his BP has been better and he has not been taking the medication. He also mentions he missed a visit with cardiology and will be rescheduling that appointment but will be waiting till then to discuses the medication. I explained that provider started him on the medication and he was to continue however he disagrees currently. I also advised he should be seen prior to making that decision and he says he is scheduling for a follow up and can discuss then.

## 2024-04-04 DIAGNOSIS — I700 Atherosclerosis of aorta: Secondary | ICD-10-CM | POA: Insufficient documentation
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
## 2024-04-05 ENCOUNTER — Ambulatory Visit (INDEPENDENT_AMBULATORY_CARE_PROVIDER_SITE_OTHER): Payer: Self-pay | Admitting: Family Medicine

## 2024-04-05 ENCOUNTER — Encounter: Payer: Self-pay | Admitting: Family Medicine

## 2024-04-05 ENCOUNTER — Ambulatory Visit: Admitting: Cardiovascular Disease

## 2024-04-05 VITALS — BP 130/83 | HR 79 | Temp 98.0°F | Ht 69.0 in | Wt 245.8 lb

## 2024-04-05 DIAGNOSIS — Z0000 Encounter for general adult medical examination without abnormal findings: Secondary | ICD-10-CM

## 2024-04-05 DIAGNOSIS — M79642 Pain in left hand: Secondary | ICD-10-CM | POA: Diagnosis not present

## 2024-04-05 DIAGNOSIS — Z720 Tobacco use: Secondary | ICD-10-CM

## 2024-04-05 DIAGNOSIS — E118 Type 2 diabetes mellitus with unspecified complications: Secondary | ICD-10-CM | POA: Diagnosis not present

## 2024-04-05 DIAGNOSIS — E782 Mixed hyperlipidemia: Secondary | ICD-10-CM

## 2024-04-05 DIAGNOSIS — I10 Essential (primary) hypertension: Secondary | ICD-10-CM | POA: Diagnosis not present

## 2024-04-05 DIAGNOSIS — I700 Atherosclerosis of aorta: Secondary | ICD-10-CM

## 2024-04-05 DIAGNOSIS — J449 Chronic obstructive pulmonary disease, unspecified: Secondary | ICD-10-CM

## 2024-04-05 DIAGNOSIS — I25118 Atherosclerotic heart disease of native coronary artery with other forms of angina pectoris: Secondary | ICD-10-CM

## 2024-04-05 LAB — MICROALBUMIN, URINE WAIVED
Creatinine, Urine Waived: 50 mg/dL (ref 10–300)
Microalb, Ur Waived: 10 mg/L (ref 0–19)
Microalb/Creat Ratio: <30 mg/g (ref <30)

## 2024-04-05 LAB — BAYER DCA HB A1C WAIVED: HB A1C (BAYER DCA - WAIVED): 6.3 % — ABNORMAL HIGH (ref 4.8–5.6)

## 2024-04-05 MED ORDER — ALBUTEROL SULFATE HFA 108 (90 BASE) MCG/ACT IN AERS: 2.0000 | INHALATION_SPRAY | Freq: Four times a day (QID) | RESPIRATORY_TRACT | 12 refills | Status: AC | PRN

## 2024-04-05 MED ORDER — OZEMPIC (0.25 OR 0.5 MG/DOSE) 2 MG/3ML ~~LOC~~ SOPN: 0.5000 mg | PEN_INJECTOR | SUBCUTANEOUS | 1 refills | Status: DC

## 2024-04-05 MED ORDER — ATORVASTATIN CALCIUM 40 MG PO TABS: 40.0000 mg | ORAL_TABLET | ORAL | 0 refills | Status: AC

## 2024-04-05 MED ORDER — FLUTICASONE-SALMETEROL 250-50 MCG/ACT IN AEPB: 1.0000 | INHALATION_SPRAY | Freq: Two times a day (BID) | RESPIRATORY_TRACT | 1 refills | Status: DC

## 2024-04-05 NOTE — Assessment & Plan Note (Signed)
 Due for repeat low dose CT. Call with any concerns.
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
## 2024-04-05 NOTE — Assessment & Plan Note (Signed)
 Did not start his amlodipine , but BP doing better. Continue off amlodipine . Recheck in 3 months. Call with any concerns.

## 2024-04-05 NOTE — Patient Instructions (Addendum)
 Appointment is scheduled for 05/12/2024 with Jimmy Sabra Cage, MD [facility] [address] [place], [place] [address] [phone]

## 2024-04-05 NOTE — Assessment & Plan Note (Signed)
 Doing well at 6.3. Would like to hold on increasing his medicine at this time. Continue diet and exercise. Follow up 3 months.

## 2024-04-06 ENCOUNTER — Ambulatory Visit: Payer: Self-pay | Admitting: Family Medicine

## 2024-04-06 LAB — PSA: Prostate Specific Ag, Serum: 1.2 ng/mL (ref 0.0–4.0)

## 2024-04-06 LAB — CBC WITH DIFFERENTIAL/PLATELET
Basophils Absolute: 0.1 x10E3/uL (ref 0.0–0.2)
Basos: 1 %
EOS (ABSOLUTE): 0.1 x10E3/uL (ref 0.0–0.4)
Eos: 2 %
Hematocrit: 46.7 % (ref 37.5–51.0)
Hemoglobin: 16.3 g/dL (ref 13.0–17.7)
Immature Grans (Abs): 0 x10E3/uL (ref 0.0–0.1)
Immature Granulocytes: 0 %
Lymphocytes Absolute: 2.1 x10E3/uL (ref 0.7–3.1)
Lymphs: 27 %
MCH: 34 pg — ABNORMAL HIGH (ref 26.6–33.0)
MCHC: 34.9 g/dL (ref 31.5–35.7)
MCV: 97 fL (ref 79–97)
Monocytes Absolute: 0.5 x10E3/uL (ref 0.1–0.9)
Monocytes: 7 %
Neutrophils Absolute: 5 x10E3/uL (ref 1.4–7.0)
Neutrophils: 63 %
Platelets: 201 x10E3/uL (ref 150–450)
RBC: 4.8 x10E6/uL (ref 4.14–5.80)
RDW: 12 % (ref 11.6–15.4)
WBC: 7.9 x10E3/uL (ref 3.4–10.8)

## 2024-04-06 LAB — COMPREHENSIVE METABOLIC PANEL WITH GFR
ALT: 29 IU/L (ref 0–44)
AST: 20 IU/L (ref 0–40)
Albumin: 4.7 g/dL (ref 3.9–4.9)
Alkaline Phosphatase: 66 IU/L (ref 44–121)
BUN/Creatinine Ratio: 14 (ref 10–24)
BUN: 12 mg/dL (ref 8–27)
Bilirubin Total: 0.3 mg/dL (ref 0.0–1.2)
CO2: 21 mmol/L (ref 20–29)
Calcium: 9.7 mg/dL (ref 8.6–10.2)
Chloride: 97 mmol/L (ref 96–106)
Creatinine, Ser: 0.86 mg/dL (ref 0.76–1.27)
Globulin, Total: 2.1 g/dL (ref 1.5–4.5)
Glucose: 150 mg/dL — ABNORMAL HIGH (ref 70–99)
Potassium: 3.4 mmol/L — ABNORMAL LOW (ref 3.5–5.2)
Sodium: 137 mmol/L (ref 134–144)
Total Protein: 6.8 g/dL (ref 6.0–8.5)
eGFR: 99 mL/min/1.73 (ref >59)

## 2024-04-06 LAB — LIPID PANEL W/O CHOL/HDL RATIO
Cholesterol, Total: 159 mg/dL (ref 100–199)
HDL: 47 mg/dL (ref >39)
LDL Chol Calc (NIH): 71 mg/dL (ref 0–99)
Triglycerides: 257 mg/dL — ABNORMAL HIGH (ref 0–149)
VLDL Cholesterol Cal: 41 mg/dL — ABNORMAL HIGH (ref 5–40)

## 2024-04-06 LAB — TSH: TSH: 2.83 u[IU]/mL (ref 0.450–4.500)

## 2024-04-18 ENCOUNTER — Other Ambulatory Visit (HOSPITAL_COMMUNITY): Payer: Self-pay

## 2024-04-18 ENCOUNTER — Telehealth: Payer: Self-pay

## 2024-04-26 ENCOUNTER — Other Ambulatory Visit (HOSPITAL_COMMUNITY): Payer: Self-pay

## 2024-04-28 NOTE — Telephone Encounter (Signed)
 ERROR

## 2024-05-12 ENCOUNTER — Encounter: Payer: Self-pay | Admitting: Cardiovascular Disease

## 2024-05-12 ENCOUNTER — Ambulatory Visit: Attending: Cardiovascular Disease | Admitting: Cardiovascular Disease

## 2024-05-12 VITALS — BP 116/72 | HR 93 | Ht 68.0 in | Wt 244.2 lb

## 2024-05-12 DIAGNOSIS — I10 Essential (primary) hypertension: Secondary | ICD-10-CM | POA: Diagnosis not present

## 2024-05-12 DIAGNOSIS — I700 Atherosclerosis of aorta: Secondary | ICD-10-CM | POA: Insufficient documentation

## 2024-05-12 DIAGNOSIS — I25118 Atherosclerotic heart disease of native coronary artery with other forms of angina pectoris: Secondary | ICD-10-CM | POA: Insufficient documentation

## 2024-05-12 DIAGNOSIS — R0602 Shortness of breath: Secondary | ICD-10-CM | POA: Diagnosis not present

## 2024-05-12 DIAGNOSIS — E782 Mixed hyperlipidemia: Secondary | ICD-10-CM | POA: Insufficient documentation

## 2024-05-12 DIAGNOSIS — I25110 Atherosclerotic heart disease of native coronary artery with unstable angina pectoris: Secondary | ICD-10-CM

## 2024-05-12 NOTE — Patient Instructions (Signed)
 Medication Instructions:  No changes *If you need a refill on your cardiac medications before your next appointment, please call your pharmacy*  Lab Work: None ordered If you have labs (blood work) drawn today and your tests are completely normal, you will receive your results only by: MyChart Message (if you have MyChart) OR A paper copy in the mail If you have any lab test that is abnormal or we need to change your treatment, we will call you to review the results.  Testing/Procedures: Your provider has ordered a exercise tolerance test. This test will evaluate the blood supply to your heart muscle during periods of exercise and rest. For this test, you will raise your heart rate by walking on a treadmill at different levels.   you may eat a light breakfast/ lunch prior to your procedure no caffeine for 24 hours prior to your test (coffee, tea, soft drinks, or chocolate)  no smoking/ vaping for 4 hours prior to your test bring any inhalers with you to your test wear comfortable clothing & tennis/ non-skid shoes to walk on the treadmill  This will take place at [address] [facility])  [place] [phone]  Your physician has requested that you have an echocardiogram. Echocardiography is a painless test that uses sound waves to create images of your heart. It provides your doctor with information about the size and shape of your heart and how well your heart's chambers and valves are working.   You may receive an ultrasound enhancing agent through an IV if needed to better visualize your heart during the echo. This procedure takes approximately one hour.  There are no restrictions for this procedure.  This will take place at [address] [facility]) [address], [place] [address]  Please note: We ask at that you not bring children with you during ultrasound (echo/ vascular) testing. Due to room size and safety concerns, children are not allowed in the  ultrasound rooms during exams. Our front office staff cannot provide observation of children in our lobby area while testing is being conducted. An adult accompanying a patient to their appointment will only be allowed in the ultrasound room at the discretion of the ultrasound technician under special circumstances. We apologize for any inconvenience.   Follow-Up: At [facility], you and your health needs are our priority.  As part of our continuing mission to provide you with exceptional heart care, our providers are all part of one team.  This team includes your primary Cardiologist (physician) and Advanced Practice Providers or APPs (Physician Assistants and Nurse Practitioners) who all work together to provide you with the care you need, when you need it.  Your next appointment:   2 month(s)  Provider:   You may see Dr. Darron or one of the following Advanced Practice Providers on your designated Care Team:   Lonni Meager, NP Lesley Maffucci, PA-C Bernardino Bring, PA-C Cadence Kenmare, PA-C Tylene Lunch, NP Barnie Hila, NP    We recommend signing up for the patient portal called MyChart.  Sign up information is provided on this After Visit Summary.  MyChart is used to connect with patients for Virtual Visits (Telemedicine).  Patients are able to view lab/test results, encounter notes, upcoming appointments, etc.  Non-urgent messages can be sent to your provider as well.   To learn more about what you can do with MyChart, go to [email or web address].
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
## 2024-05-23 ENCOUNTER — Other Ambulatory Visit

## 2024-05-30 ENCOUNTER — Ambulatory Visit: Payer: Self-pay | Admitting: Cardiovascular Disease

## 2024-05-30 ENCOUNTER — Ambulatory Visit
Admission: RE | Admit: 2024-05-30 | Discharge: 2024-05-30 | Disposition: A | Discharge status: Home / Self Care | Source: Ambulatory Visit | Attending: Cardiovascular Disease | Admitting: Cardiovascular Disease

## 2024-05-30 DIAGNOSIS — M79606 Pain in leg, unspecified: Secondary | ICD-10-CM

## 2024-05-30 DIAGNOSIS — R0602 Shortness of breath: Secondary | ICD-10-CM | POA: Diagnosis present

## 2024-05-30 LAB — EXERCISE TOLERANCE TEST
Angina Index: 0
Duke Treadmill Score: 7
Exercise duration (min): 6 min
Exercise duration (sec): 32 s
MPHR: 159 {beats}/min
Peak HR: 141 {beats}/min
Percent HR: 88 %
Rest HR: 82 {beats}/min
ST Depression (mm): 0 mm

## 2024-06-16 ENCOUNTER — Telehealth: Payer: Self-pay

## 2024-06-16 NOTE — Telephone Encounter (Signed)
 Copied from CRM [id number]. Topic: Clinical - Medication Prior Auth >> Jun 16, 2024 12:38 PM Darshell M wrote: Reason for CRM:  Semaglutide ,0.25 or 0.5MG /DOS, (OZEMPIC , 0.25 OR 0.5 MG/DOSE,) 2 MG/3ML [facility]  [id number] [place], [place] - [address] [place], [place] [address] Phone: [phone]  Fax: [phone]   [employer] requiring prior authorization before approving medication.   Cherish from [employer] to [place] [phone].

## 2024-06-16 NOTE — Telephone Encounter (Signed)
 PA team- can you guys check into this prior authorization please?

## 2024-06-17 ENCOUNTER — Other Ambulatory Visit (HOSPITAL_COMMUNITY): Payer: Self-pay

## 2024-06-17 NOTE — Telephone Encounter (Signed)
PA request has been Submitted.

## 2024-06-29 ENCOUNTER — Ambulatory Visit: Attending: Cardiovascular Disease

## 2024-06-29 DIAGNOSIS — R0602 Shortness of breath: Secondary | ICD-10-CM | POA: Insufficient documentation

## 2024-06-29 LAB — ECHOCARDIOGRAM COMPLETE
Area-P 1/2: 4.68 cm2
S' Lateral: 3.1 cm

## 2024-07-07 ENCOUNTER — Ambulatory Visit: Admitting: Family Medicine

## 2024-07-07 ENCOUNTER — Encounter: Payer: Self-pay | Admitting: Family Medicine

## 2024-07-07 VITALS — BP 131/75 | HR 90 | Temp 98.2°F | Ht 68.0 in | Wt 244.4 lb

## 2024-07-07 DIAGNOSIS — Z720 Tobacco use: Secondary | ICD-10-CM

## 2024-07-07 DIAGNOSIS — E118 Type 2 diabetes mellitus with unspecified complications: Secondary | ICD-10-CM | POA: Diagnosis not present

## 2024-07-07 DIAGNOSIS — Z23 Encounter for immunization: Secondary | ICD-10-CM

## 2024-07-07 LAB — BAYER DCA HB A1C WAIVED: HB A1C (BAYER DCA - WAIVED): 6.4 % — ABNORMAL HIGH (ref 4.8–5.6)

## 2024-07-07 MED ORDER — SEMAGLUTIDE (1 MG/DOSE) 4 MG/3ML ~~LOC~~ SOPN: 1.0000 mg | PEN_INJECTOR | SUBCUTANEOUS | 1 refills | Status: DC

## 2024-07-07 MED ORDER — LOSARTAN POTASSIUM-HCTZ 100-25 MG PO TABS: 1.0000 | ORAL_TABLET | Freq: Every day | ORAL | 1 refills | Status: DC

## 2024-07-07 NOTE — Assessment & Plan Note (Signed)
 Stable with A1c of 6.4. Will increase his ozempic  to 1mg  and recheck in 3 months. Call with any concerns.

## 2024-07-07 NOTE — Assessment & Plan Note (Signed)
 Has not gotten his CT- ordered again today.

## 2024-07-07 NOTE — Progress Notes (Signed)
 BP 131/75   Pulse 90   Temp 98.2 F (36.8 C) (Oral)   Ht 5' 8 (1.727 m)   Wt 244 lb 6.4 oz (110.9 kg)   SpO2 94%   BMI 37.16 kg/m    Subjective:    Patient ID: Jimmy Simmons, male    DOB: 1960-11-14, 63 y.o.   MRN: [id number]  HPI: Jimmy Simmons is a 63 y.o. male  Chief Complaint  Patient presents with   Diabetes   DIABETES Hypoglycemic episodes:no Polydipsia/polyuria: no Visual disturbance: no Chest pain: no Paresthesias: no Glucose Monitoring: no  Accucheck frequency: Not Checking Taking Insulin?: no Blood Pressure Monitoring: not checking Retinal Examination: Not up to Date Foot Exam: Up to Date Diabetic Education: Completed Pneumovax: Up to Date Influenza: Up to Date Aspirin: no   Relevant past medical, surgical, family and social history reviewed and updated as indicated. Interim medical history since our last visit reviewed. Allergies and medications reviewed and updated.  Review of Systems  Constitutional: Negative.   Respiratory: Negative.    Cardiovascular: Negative.   Musculoskeletal: Negative.   Neurological: Negative.   Psychiatric/Behavioral: Negative.      Per HPI unless specifically indicated above     Objective:    BP 131/75   Pulse 90   Temp 98.2 F (36.8 C) (Oral)   Ht 5' 8 (1.727 m)   Wt 244 lb 6.4 oz (110.9 kg)   SpO2 94%   BMI 37.16 kg/m   Wt Readings from Last 3 Encounters:  07/07/24 244 lb 6.4 oz (110.9 kg)  05/12/24 244 lb 4 oz (110.8 kg)  04/05/24 245 lb 12.8 oz (111.5 kg)    Physical Exam Vitals and nursing note reviewed.  Constitutional:      General: He is not in acute distress.    Appearance: Normal appearance. He is not ill-appearing, toxic-appearing or diaphoretic.  HENT:     Head: Normocephalic and atraumatic.     Right Ear: External ear normal.     Left Ear: External ear normal.     Nose: Nose normal.     Mouth/Throat:     Mouth: Mucous membranes are moist.     Pharynx: Oropharynx is clear.  Eyes:      General: No scleral icterus.       Right eye: No discharge.        Left eye: No discharge.     Extraocular Movements: Extraocular movements intact.     Conjunctiva/sclera: Conjunctivae normal.     Pupils: Pupils are equal, round, and reactive to light.  Cardiovascular:     Rate and Rhythm: Normal rate and regular rhythm.     Pulses: Normal pulses.     Heart sounds: Normal heart sounds. No murmur heard.    No friction rub. No gallop.  Pulmonary:     Effort: Pulmonary effort is normal. No respiratory distress.     Breath sounds: Normal breath sounds. No stridor. No wheezing, rhonchi or rales.  Chest:     Chest wall: No tenderness.  Musculoskeletal:        General: Normal range of motion.     Cervical back: Normal range of motion and neck supple.  Skin:    General: Skin is warm and dry.     Capillary Refill: Capillary refill takes less than 2 seconds.     Coloration: Skin is not jaundiced or pale.     Findings: No bruising, erythema, lesion or rash.  Neurological:     General:  No focal deficit present.     Mental Status: He is alert and oriented to person, place, and time. Mental status is at baseline.  Psychiatric:        Mood and Affect: Mood normal.        Behavior: Behavior normal.        Thought Content: Thought content normal.        Judgment: Judgment normal.     Results for orders placed or performed in visit on 06/29/24  ECHOCARDIOGRAM COMPLETE   Collection Time: 06/29/24 11:15 AM  Result Value Ref Range   S' Lateral 3.10 cm   Area-P 1/2 4.68 cm2   Est EF 55 - 60%       Assessment & Plan:   Problem List Items Addressed This Visit       Endocrine   Controlled type 2 diabetes mellitus with complication, without long-term current use of insulin (HCC) - Primary   Stable with A1c of 6.4. Will increase his ozempic  to 1mg  and recheck in 3 months. Call with any concerns.       Relevant Medications   losartan -hydrochlorothiazide  (HYZAAR) 100-25 MG tablet    Semaglutide , 1 MG/DOSE, 4 MG/3ML SOPN   Other Relevant Orders   Bayer DCA Hb A1c Waived     Other   Tobacco abuse   Has not gotten his CT- ordered again today.      Relevant Orders   CT CHEST LUNG CA SCREEN LOW DOSE W/O CM   Other Visit Diagnoses       Need for pneumococcal 20-valent conjugate vaccination       Relevant Orders   Pneumococcal conjugate vaccine 20-valent (Prevnar 20)        Follow up plan: No follow-ups on file.

## 2024-07-08 ENCOUNTER — Other Ambulatory Visit: Payer: Self-pay

## 2024-07-08 DIAGNOSIS — Z122 Encounter for screening for malignant neoplasm of respiratory organs: Secondary | ICD-10-CM

## 2024-07-08 DIAGNOSIS — Z87891 Personal history of nicotine dependence: Secondary | ICD-10-CM

## 2024-07-08 DIAGNOSIS — F17210 Nicotine dependence, cigarettes, uncomplicated: Secondary | ICD-10-CM

## 2024-07-12 ENCOUNTER — Ambulatory Visit: Attending: Cardiovascular Disease

## 2024-07-12 DIAGNOSIS — M79606 Pain in leg, unspecified: Secondary | ICD-10-CM | POA: Diagnosis present

## 2024-07-12 DIAGNOSIS — M79605 Pain in left leg: Secondary | ICD-10-CM | POA: Insufficient documentation

## 2024-07-12 DIAGNOSIS — M79604 Pain in right leg: Secondary | ICD-10-CM | POA: Insufficient documentation

## 2024-07-12 LAB — VAS US ABI WITH/WO TBI
Left ABI: 0.75
Right ABI: 0.64

## 2024-07-13 ENCOUNTER — Ambulatory Visit: Attending: Cardiology | Admitting: Cardiology

## 2024-07-13 NOTE — Progress Notes (Deleted)
 Cardiology Office Note   Date:  07/13/2024  ID:  Jimmy Simmons, DOB 1960/10/21, MRN [id number] PCP: Vicci Duwaine SQUIBB, DO  [facility] Providers Cardiologist:  Deatrice Cage, MD { Click to update primary MD,subspecialty MD or APP then REFRESH:1}    History of Present Illness Jimmy Simmons is a 63 y.o. male with a past medical history of coronary calcifications, type 2 diabetes, essential hypertension, tobacco use, and hyperlipidemia, who is here today for follow-up.  He was last seen in clinic 11//25 after referral from his primary care provider for an evaluation of exertional dyspnea and coronary calcifications noted on CT lung cancer screening completed in February 2024.  He has a strong family history of premature coronary artery disease.  Smokes 1 pack/day.  Denies any recent chest pain but does report significant exertional dyspnea and thinks is due to continued smoking.  He denies any other symptoms and is not interested in smoking cessation at his appointment.  He endorsed a 36-pack-year smoking history is no use smokeless tobacco.  Reports current alcohol use of about 30 to standard drinks of alcohol per week and reports drug use of marijuana.  He was scheduled for an echocardiogram and a treadmill stress test.  He returns to clinic today  ROS: 10 point review of system has been reviewed and considered negative with the exception was been listed in the HPI  Studies Reviewed     ABI's 07/12/2024 Summary:  Right: Resting right ankle-brachial index indicates moderate right lower  extremity arterial disease. The right toe-brachial index is abnormal.    Left: Resting left ankle-brachial index indicates mild left lower  extremity arterial disease. The left toe-brachial index is abnormal.  Left toe pressure is >60 mmHg which suggests adequate perfusion for  healing.   2d echo 06/29/2024 1. Left ventricular ejection fraction, by estimation, is 55 to 60%. The  left ventricle has  normal function. The left ventricle demonstrates  regional wall motion abnormalities (basal to mid inferior wall  hypokinesis).   2. Right ventricular systolic function is normal. The right ventricular  size is normal.   3. The mitral valve is normal in structure. Mild mitral valve  regurgitation. No evidence of mitral stenosis.   4. The aortic valve is normal in structure. Aortic valve regurgitation is  not visualized. No aortic stenosis is present.   5. The inferior vena cava is normal in size with greater than 50%  respiratory variability, suggesting right atrial pressure of 3 mmHg.   Exercise tolerance test 05/30/2024   Exercise capacity was mildly impaired. Patient exercised for 6 min and 32 sec. Maximum HR of 141 bpm. MPHR 88.0%. The test was stopped because the patient experienced dyspnea and leg pain. Leg pain symptoms concerning for claudication.   No ST deviation was noted. The ECG was negative for ischemia.   Consider an ABI for exertional leg pain.  Risk Assessment/Calculations   No BP recorded.  {Refresh Note OR Click here to enter BP  :1}***       Physical Exam VS:  There were no vitals taken for this visit.       Wt Readings from Last 3 Encounters:  07/07/24 244 lb 6.4 oz (110.9 kg)  05/12/24 244 lb 4 oz (110.8 kg)  04/05/24 245 lb 12.8 oz (111.5 kg)    GEN: Well nourished, well developed in no acute distress NECK: No JVD; No carotid bruits CARDIAC: ***RRR, no murmurs, rubs, gallops RESPIRATORY:  Clear to auscultation without rales, wheezing  or rhonchi  ABDOMEN: Soft, non-tender, non-distended EXTREMITIES:  No edema; No deformity   ASSESSMENT AND PLAN ***    {Are you ordering a CV Procedure (e.g. stress test, cath, DCCV, TEE, etc)?   Press F2        :[id number]}  Dispo: ***  Signed, Estrella Alcaraz, NP

## 2024-07-14 ENCOUNTER — Ambulatory Visit: Payer: Self-pay | Admitting: Cardiovascular Disease

## 2024-08-03 ENCOUNTER — Encounter: Payer: Self-pay | Admitting: Family Medicine

## 2024-08-05 LAB — OPHTHALMOLOGY REPORT-SCANNED

## 2024-08-09 ENCOUNTER — Ambulatory Visit: Admitting: Cardiovascular Disease

## 2024-08-09 ENCOUNTER — Encounter: Payer: Self-pay | Admitting: Acute Care

## 2024-09-27 ENCOUNTER — Ambulatory Visit: Admitting: Cardiovascular Disease

## 2024-10-04 ENCOUNTER — Ambulatory Visit: Admitting: Family Medicine

## 2024-10-04 ENCOUNTER — Encounter: Payer: Self-pay | Admitting: Family Medicine

## 2024-10-04 VITALS — BP 120/70 | HR 87 | Temp 97.7°F | Resp 16 | Ht 67.99 in | Wt 248.6 lb

## 2024-10-04 DIAGNOSIS — E782 Mixed hyperlipidemia: Secondary | ICD-10-CM

## 2024-10-04 DIAGNOSIS — Z7985 Long-term (current) use of injectable non-insulin antidiabetic drugs: Secondary | ICD-10-CM

## 2024-10-04 DIAGNOSIS — E119 Type 2 diabetes mellitus without complications: Secondary | ICD-10-CM | POA: Diagnosis not present

## 2024-10-04 DIAGNOSIS — M79642 Pain in left hand: Secondary | ICD-10-CM

## 2024-10-04 DIAGNOSIS — M79641 Pain in right hand: Secondary | ICD-10-CM | POA: Diagnosis not present

## 2024-10-04 DIAGNOSIS — I10 Essential (primary) hypertension: Secondary | ICD-10-CM | POA: Diagnosis not present

## 2024-10-04 LAB — HM DIABETES EYE EXAM

## 2024-10-04 LAB — BAYER DCA HB A1C WAIVED: HB A1C (BAYER DCA - WAIVED): 6.4 % — ABNORMAL HIGH (ref 4.8–5.6)

## 2024-10-04 MED ORDER — FLUTICASONE-SALMETEROL 250-50 MCG/ACT IN AEPB: 1.0000 | INHALATION_SPRAY | Freq: Two times a day (BID) | RESPIRATORY_TRACT | 1 refills | Status: AC

## 2024-10-04 MED ORDER — LOSARTAN POTASSIUM-HCTZ 100-25 MG PO TABS: 1.0000 | ORAL_TABLET | Freq: Every day | ORAL | 1 refills | Status: AC

## 2024-10-04 MED ORDER — SEMAGLUTIDE (1 MG/DOSE) 4 MG/3ML ~~LOC~~ SOPN: 1.0000 mg | PEN_INJECTOR | SUBCUTANEOUS | 1 refills | Status: AC

## 2024-10-04 NOTE — Assessment & Plan Note (Signed)
 Under good control on current regimen. Continue current regimen. Continue to monitor. Call with any concerns. Refills given. Labs drawn today.

## 2024-10-04 NOTE — Progress Notes (Signed)
 "  BP 120/70   Pulse 87   Temp 97.7 F (36.5 C) (Oral)   Resp 16   Ht 5' 7.99 (1.727 m)   Wt 248 lb 9.6 oz (112.8 kg)   SpO2 97%   BMI 37.81 kg/m    Subjective:    Patient ID: Jimmy Simmons, male    DOB: 29-Mar-1961, 64 y.o.   MRN: [id number]  HPI: Jimmy Simmons is a 64 y.o. male  Chief Complaint  Patient presents with   Hypertension   Hyperlipidemia   Diabetes   DIABETES Hypoglycemic episodes:no Polydipsia/polyuria: no Visual disturbance: no Chest pain: no Paresthesias: no Glucose Monitoring: no  Accucheck frequency: Not Checking Taking Insulin?: no Blood Pressure Monitoring: not checking Retinal Examination: going tomorrow Foot Exam: Up to Date Diabetic Education: Completed Pneumovax: Up to Date Influenza: Up to Date Aspirin: no  HYPERTENSION / HYPERLIPIDEMIA Satisfied with current treatment? yes Duration of hypertension: chronic BP monitoring frequency: not checking BP medication side effects: no Past BP meds: losartan -HCTZ Duration of hyperlipidemia: chronic Cholesterol medication side effects: no Cholesterol supplements: none Past cholesterol medications: atorvastatin  Medication compliance: excellent compliance Aspirin: no Recent stressors: no Recurrent headaches: no Visual changes: no Palpitations: no Dyspnea: no Chest pain: no Lower extremity edema: no Dizzy/lightheaded: no  Relevant past medical, surgical, family and social history reviewed and updated as indicated. Interim medical history since our last visit reviewed. Allergies and medications reviewed and updated.  Review of Systems  Constitutional: Negative.   Respiratory: Negative.    Cardiovascular: Negative.   Musculoskeletal: Negative.   Neurological: Negative.   Psychiatric/Behavioral: Negative.      Per HPI unless specifically indicated above     Objective:    BP 120/70   Pulse 87   Temp 97.7 F (36.5 C) (Oral)   Resp 16   Ht 5' 7.99 (1.727 m)   Wt 248 lb 9.6 oz (112.8  kg)   SpO2 97%   BMI 37.81 kg/m   Wt Readings from Last 3 Encounters:  10/04/24 248 lb 9.6 oz (112.8 kg)  07/07/24 244 lb 6.4 oz (110.9 kg)  05/12/24 244 lb 4 oz (110.8 kg)    Physical Exam Vitals and nursing note reviewed.  Constitutional:      General: He is not in acute distress.    Appearance: Normal appearance. He is obese. He is not ill-appearing, toxic-appearing or diaphoretic.  HENT:     Head: Normocephalic and atraumatic.     Right Ear: External ear normal.     Left Ear: External ear normal.     Nose: Nose normal.     Mouth/Throat:     Mouth: Mucous membranes are moist.     Pharynx: Oropharynx is clear.  Eyes:     General: No scleral icterus.       Right eye: No discharge.        Left eye: No discharge.     Extraocular Movements: Extraocular movements intact.     Conjunctiva/sclera: Conjunctivae normal.     Pupils: Pupils are equal, round, and reactive to light.  Cardiovascular:     Rate and Rhythm: Normal rate and regular rhythm.     Pulses: Normal pulses.     Heart sounds: Normal heart sounds. No murmur heard.    No friction rub. No gallop.  Pulmonary:     Effort: Pulmonary effort is normal. No respiratory distress.     Breath sounds: Normal breath sounds. No stridor. No wheezing, rhonchi or rales.  Chest:  Chest wall: No tenderness.  Musculoskeletal:        General: Normal range of motion.     Cervical back: Normal range of motion and neck supple.  Skin:    General: Skin is warm and dry.     Capillary Refill: Capillary refill takes less than 2 seconds.     Coloration: Skin is not jaundiced or pale.     Findings: No bruising, erythema, lesion or rash.  Neurological:     General: No focal deficit present.     Mental Status: He is alert and oriented to person, place, and time. Mental status is at baseline.  Psychiatric:        Mood and Affect: Mood normal.        Behavior: Behavior normal.        Thought Content: Thought content normal.         Judgment: Judgment normal.     Results for orders placed or performed in visit on 10/04/24  Bayer DCA Hb A1c Waived   Collection Time: 10/04/24 10:19 AM  Result Value Ref Range   HB A1C (BAYER DCA - WAIVED) 6.4 (H) 4.8 - 5.6 %      Assessment & Plan:   Problem List Items Addressed This Visit       Cardiovascular and Mediastinum   HTN (hypertension)   Under good control on current regimen. Continue current regimen. Continue to monitor. Call with any concerns. Refills given. Labs drawn today.        Relevant Medications   losartan -hydrochlorothiazide  (HYZAAR) 100-25 MG tablet     Endocrine   Controlled type 2 diabetes mellitus without complication, without long-term current use of insulin (HCC) - Primary   Doing great with A1c of 6.4. Continue current regimen. Continue to monitor. Call with any concerns.       Relevant Medications   losartan -hydrochlorothiazide  (HYZAAR) 100-25 MG tablet   Semaglutide , 1 MG/DOSE, 4 MG/3ML SOPN   Other Relevant Orders   Bayer DCA Hb A1c Waived (Completed)   CBC with Differential/Platelet   Lipid Panel w/o Chol/HDL Ratio   Comprehensive metabolic panel with GFR     Other   Hyperlipidemia   Under good control on current regimen. Continue current regimen. Continue to monitor. Call with any concerns. Refills given. Labs drawn today.       Relevant Medications   losartan -hydrochlorothiazide  (HYZAAR) 100-25 MG tablet   Other Visit Diagnoses       Bilateral hand pain       Concern for arthritis. will check x-rays. Await results. Treat as needed.   Relevant Orders   DG Hand Complete Right   DG Hand Complete Left        Follow up plan: Return in about 6 months (around 04/03/2025) for physical.      "

## 2024-10-04 NOTE — Assessment & Plan Note (Signed)
Doing great with A1c of 6.4. Continue current regimen. Continue to monitor. Call with any concerns.  

## 2024-10-05 LAB — LIPID PANEL W/O CHOL/HDL RATIO
Cholesterol, Total: 166 mg/dL (ref 100–199)
HDL: 48 mg/dL
LDL Chol Calc (NIH): 88 mg/dL (ref 0–99)
Triglycerides: 177 mg/dL — ABNORMAL HIGH (ref 0–149)
VLDL Cholesterol Cal: 30 mg/dL (ref 5–40)

## 2024-10-05 LAB — CBC WITH DIFFERENTIAL/PLATELET
Basophils Absolute: 0.1 10*3/uL (ref 0.0–0.2)
Basos: 1 %
EOS (ABSOLUTE): 0.1 10*3/uL (ref 0.0–0.4)
Eos: 1 %
Hematocrit: 45.2 % (ref 37.5–51.0)
Hemoglobin: 15.7 g/dL (ref 13.0–17.7)
Immature Grans (Abs): 0 10*3/uL (ref 0.0–0.1)
Immature Granulocytes: 0 %
Lymphocytes Absolute: 2 10*3/uL (ref 0.7–3.1)
Lymphs: 23 %
MCH: 34.1 pg — ABNORMAL HIGH (ref 26.6–33.0)
MCHC: 34.7 g/dL (ref 31.5–35.7)
MCV: 98 fL — ABNORMAL HIGH (ref 79–97)
Monocytes Absolute: 0.5 10*3/uL (ref 0.1–0.9)
Monocytes: 6 %
Neutrophils Absolute: 6.2 10*3/uL (ref 1.4–7.0)
Neutrophils: 69 %
Platelets: 183 10*3/uL (ref 150–450)
RBC: 4.61 x10E6/uL (ref 4.14–5.80)
RDW: 12 % (ref 11.6–15.4)
WBC: 8.9 10*3/uL (ref 3.4–10.8)

## 2024-10-05 LAB — COMPREHENSIVE METABOLIC PANEL WITH GFR
ALT: 29 [IU]/L (ref 0–44)
AST: 20 [IU]/L (ref 0–40)
Albumin: 4.6 g/dL (ref 3.9–4.9)
Alkaline Phosphatase: 62 [IU]/L (ref 47–123)
BUN/Creatinine Ratio: 12 (ref 10–24)
BUN: 12 mg/dL (ref 8–27)
Bilirubin Total: 0.4 mg/dL (ref 0.0–1.2)
CO2: 23 mmol/L (ref 20–29)
Calcium: 9.7 mg/dL (ref 8.6–10.2)
Chloride: 98 mmol/L (ref 96–106)
Creatinine, Ser: 0.97 mg/dL (ref 0.76–1.27)
Globulin, Total: 2.3 g/dL (ref 1.5–4.5)
Glucose: 156 mg/dL — ABNORMAL HIGH (ref 70–99)
Potassium: 3.4 mmol/L — ABNORMAL LOW (ref 3.5–5.2)
Sodium: 140 mmol/L (ref 134–144)
Total Protein: 6.9 g/dL (ref 6.0–8.5)
eGFR: 88 mL/min/{1.73_m2}

## 2024-10-07 ENCOUNTER — Ambulatory Visit: Admitting: Family Medicine

## 2024-10-07 ENCOUNTER — Ambulatory Visit: Payer: Self-pay | Admitting: Family Medicine

## 2024-10-11 ENCOUNTER — Ambulatory Visit
Admission: RE | Admit: 2024-10-11 | Discharge: 2024-10-11 | Disposition: A | Source: Home / Self Care | Attending: Family Medicine | Admitting: Family Medicine

## 2024-10-11 ENCOUNTER — Ambulatory Visit
Admission: RE | Admit: 2024-10-11 | Discharge: 2024-10-11 | Disposition: A | Source: Ambulatory Visit | Attending: Family Medicine

## 2024-10-11 ENCOUNTER — Ambulatory Visit
Admission: RE | Admit: 2024-10-11 | Discharge: 2024-10-11 | Disposition: A | Source: Ambulatory Visit | Attending: Acute Care | Admitting: Acute Care

## 2024-10-11 ENCOUNTER — Encounter: Payer: Self-pay | Admitting: Family Medicine

## 2024-10-11 DIAGNOSIS — F17210 Nicotine dependence, cigarettes, uncomplicated: Secondary | ICD-10-CM

## 2024-10-11 DIAGNOSIS — Z87891 Personal history of nicotine dependence: Secondary | ICD-10-CM

## 2024-10-11 DIAGNOSIS — M79641 Pain in right hand: Secondary | ICD-10-CM

## 2024-10-11 DIAGNOSIS — Z122 Encounter for screening for malignant neoplasm of respiratory organs: Secondary | ICD-10-CM

## 2024-10-13 ENCOUNTER — Other Ambulatory Visit: Payer: Self-pay

## 2024-10-13 DIAGNOSIS — Z87891 Personal history of nicotine dependence: Secondary | ICD-10-CM

## 2024-10-13 DIAGNOSIS — F17210 Nicotine dependence, cigarettes, uncomplicated: Secondary | ICD-10-CM

## 2024-10-13 DIAGNOSIS — Z122 Encounter for screening for malignant neoplasm of respiratory organs: Secondary | ICD-10-CM

## 2024-11-16 ENCOUNTER — Ambulatory Visit: Admitting: Cardiovascular Disease

## 2025-04-06 ENCOUNTER — Encounter: Admitting: Family Medicine
